# Patient Record
Sex: Male | Born: 1945 | Race: White | Hispanic: No | Marital: Married | Smoking: Former smoker
Health system: Southern US, Community
[De-identification: ages and names within clinical notes are randomized; demographics above are authoritative.]

## PROBLEM LIST (undated history)

## (undated) DIAGNOSIS — E785 Hyperlipidemia, unspecified: Secondary | ICD-10-CM

## (undated) DIAGNOSIS — I509 Heart failure, unspecified: Secondary | ICD-10-CM

## (undated) DIAGNOSIS — I2510 Atherosclerotic heart disease of native coronary artery without angina pectoris: Secondary | ICD-10-CM

## (undated) DIAGNOSIS — I10 Essential (primary) hypertension: Secondary | ICD-10-CM

## (undated) DIAGNOSIS — I255 Ischemic cardiomyopathy: Secondary | ICD-10-CM

## (undated) DIAGNOSIS — I252 Old myocardial infarction: Secondary | ICD-10-CM

## (undated) HISTORY — DX: Atherosclerotic heart disease of native coronary artery without angina pectoris: I25.10

## (undated) HISTORY — DX: Ischemic cardiomyopathy: I25.5

## (undated) HISTORY — DX: Old myocardial infarction: I25.2

## (undated) HISTORY — DX: Hyperlipidemia, unspecified: E78.5

## (undated) HISTORY — DX: Essential (primary) hypertension: I10

---

## 2006-07-31 HISTORY — PX: LEFT HEART CATH: SHX5946

## 2006-08-13 ENCOUNTER — Inpatient Hospital Stay (HOSPITAL_COMMUNITY): Admission: EM | Admit: 2006-08-13 | Discharge: 2006-08-16 | Payer: Self-pay | Admitting: Emergency Medicine

## 2006-08-13 ENCOUNTER — Ambulatory Visit: Payer: Self-pay | Admitting: Internal Medicine

## 2006-08-16 ENCOUNTER — Encounter: Payer: Self-pay | Admitting: Cardiology

## 2006-08-28 ENCOUNTER — Ambulatory Visit: Payer: Self-pay | Admitting: Cardiology

## 2006-09-26 ENCOUNTER — Ambulatory Visit: Payer: Self-pay | Admitting: Internal Medicine

## 2007-02-28 ENCOUNTER — Ambulatory Visit: Payer: Self-pay | Admitting: Internal Medicine

## 2007-06-04 ENCOUNTER — Emergency Department (HOSPITAL_COMMUNITY): Admission: EM | Admit: 2007-06-04 | Discharge: 2007-06-04 | Payer: Self-pay | Admitting: Emergency Medicine

## 2007-09-25 ENCOUNTER — Ambulatory Visit: Payer: Self-pay | Admitting: Internal Medicine

## 2008-08-24 ENCOUNTER — Ambulatory Visit: Payer: Self-pay | Admitting: Internal Medicine

## 2008-08-26 ENCOUNTER — Ambulatory Visit: Payer: Self-pay

## 2008-08-26 ENCOUNTER — Ambulatory Visit: Payer: Self-pay | Admitting: Internal Medicine

## 2008-08-26 LAB — CONVERTED CEMR LAB
AST: 28 units/L (ref 0–37)
Albumin: 3.9 g/dL (ref 3.5–5.2)
Alkaline Phosphatase: 44 units/L (ref 39–117)
BUN: 17 mg/dL (ref 6–23)
Bilirubin, Direct: 0.2 mg/dL (ref 0.0–0.3)
CO2: 33 meq/L — ABNORMAL HIGH (ref 19–32)
Calcium: 9.1 mg/dL (ref 8.4–10.5)
Chloride: 105 meq/L (ref 96–112)
GFR calc Af Amer: 88 mL/min
GFR calc non Af Amer: 72 mL/min
Potassium: 4.7 meq/L (ref 3.5–5.1)
Sodium: 141 meq/L (ref 135–145)
Total Bilirubin: 0.9 mg/dL (ref 0.3–1.2)
Total CHOL/HDL Ratio: 5.7
Total Protein: 6.6 g/dL (ref 6.0–8.3)

## 2009-02-22 ENCOUNTER — Telehealth (INDEPENDENT_AMBULATORY_CARE_PROVIDER_SITE_OTHER): Payer: Self-pay | Admitting: *Deleted

## 2009-04-27 ENCOUNTER — Encounter: Payer: Self-pay | Admitting: Internal Medicine

## 2009-05-31 ENCOUNTER — Ambulatory Visit: Payer: Self-pay | Admitting: Internal Medicine

## 2009-05-31 DIAGNOSIS — E785 Hyperlipidemia, unspecified: Secondary | ICD-10-CM

## 2009-05-31 DIAGNOSIS — I2510 Atherosclerotic heart disease of native coronary artery without angina pectoris: Secondary | ICD-10-CM

## 2009-05-31 DIAGNOSIS — I10 Essential (primary) hypertension: Secondary | ICD-10-CM

## 2009-06-15 ENCOUNTER — Ambulatory Visit (HOSPITAL_BASED_OUTPATIENT_CLINIC_OR_DEPARTMENT_OTHER): Admission: RE | Admit: 2009-06-15 | Discharge: 2009-06-15 | Payer: Self-pay | Admitting: Otolaryngology

## 2009-06-20 ENCOUNTER — Ambulatory Visit: Payer: Self-pay | Admitting: Internal Medicine

## 2009-07-08 ENCOUNTER — Ambulatory Visit (HOSPITAL_COMMUNITY): Admission: AD | Admit: 2009-07-08 | Discharge: 2009-07-09 | Payer: Self-pay | Admitting: Otolaryngology

## 2009-07-08 ENCOUNTER — Encounter (INDEPENDENT_AMBULATORY_CARE_PROVIDER_SITE_OTHER): Payer: Self-pay | Admitting: Otolaryngology

## 2009-08-16 ENCOUNTER — Telehealth: Payer: Self-pay | Admitting: Internal Medicine

## 2009-09-24 ENCOUNTER — Telehealth: Payer: Self-pay | Admitting: Internal Medicine

## 2009-09-30 ENCOUNTER — Ambulatory Visit: Payer: Self-pay | Admitting: Internal Medicine

## 2009-09-30 DIAGNOSIS — R072 Precordial pain: Secondary | ICD-10-CM

## 2009-10-01 ENCOUNTER — Encounter: Payer: Self-pay | Admitting: Internal Medicine

## 2009-10-04 ENCOUNTER — Ambulatory Visit: Payer: Self-pay | Admitting: Internal Medicine

## 2009-10-04 ENCOUNTER — Inpatient Hospital Stay (HOSPITAL_BASED_OUTPATIENT_CLINIC_OR_DEPARTMENT_OTHER): Admission: RE | Admit: 2009-10-04 | Discharge: 2009-10-04 | Payer: Self-pay | Admitting: Internal Medicine

## 2009-12-07 ENCOUNTER — Ambulatory Visit: Payer: Self-pay | Admitting: Internal Medicine

## 2010-01-24 ENCOUNTER — Telehealth (INDEPENDENT_AMBULATORY_CARE_PROVIDER_SITE_OTHER): Payer: Self-pay | Admitting: *Deleted

## 2010-01-27 ENCOUNTER — Telehealth (INDEPENDENT_AMBULATORY_CARE_PROVIDER_SITE_OTHER): Payer: Self-pay | Admitting: *Deleted

## 2010-08-28 LAB — CONVERTED CEMR LAB
Basophils Absolute: 0 10*3/uL (ref 0.0–0.1)
CO2: 32 meq/L (ref 19–32)
Calcium: 9.2 mg/dL (ref 8.4–10.5)
HCT: 45 % (ref 39.0–52.0)
INR: 1 (ref 0.8–1.0)
Lymphs Abs: 1.9 10*3/uL (ref 0.7–4.0)
Monocytes Absolute: 0.5 10*3/uL (ref 0.1–1.0)
Neutrophils Relative %: 62 % (ref 43.0–77.0)
Potassium: 4 meq/L (ref 3.5–5.1)
Prothrombin Time: 10.8 s (ref 9.1–11.7)
RBC: 4.85 M/uL (ref 4.22–5.81)
Sodium: 140 meq/L (ref 135–145)
WBC: 7.3 10*3/uL (ref 4.5–10.5)

## 2010-08-30 NOTE — Progress Notes (Signed)
  Request recieved from Dept [facility] sent to [facility]  January 27, 2010 8:00 AM

## 2010-08-30 NOTE — Cardiovascular Report (Signed)
Summary: Pre-Cath Orders  Pre-Cath Orders   Imported By: Marylou Mccoy 12/30/2009 12:08:31  _____________________________________________________________________  External Attachment:    Type:   Image     Comment:   External Document

## 2010-08-30 NOTE — Progress Notes (Signed)
Summary: increased fatigue  Phone Note Call from Patient   Summary of Call: Spoke w/pt he c/o having no energy and just feeling wiped out, he states this is the same type symptom he had before his last stent, no chest pain or discomfort and no sob, sch appt w/Dr Bensimhon for 3/3 at 3:15, pt aware if dev. cp to c/b or go to ER pt agreeable Meredith Staggers, RN  September 24, 2009 2:47 PM

## 2010-08-30 NOTE — Letter (Signed)
Summary: Cardiac Catheterization Instructions- [facility]  [facility], Main Office  [address]   [place], [place] [address]   Phone: [phone]  Fax: [phone]     09/30/2009 MRN: [id number]  [name] [address] [place], [place]  [address]  Dear Mr. FINI,   You are scheduled for a Cardiac Catheterization on Monday 3/7 with Dr.Yakima Kreitzer  Please arrive to the 1st floor of the Heart and Vascular Center at [facility] at _____ am / pm on the day of your procedure. Please do not arrive before 6:30 a.m. Call the Heart and Vascular Center at [phone] if you are unable to make your appointmnet. The Code to get into the parking garage under the building is 9000. Take the elevators to the 1st floor. You must have someone to drive you home. Someone must be with you for the first 24 hours after you arrive home. Please wear clothes that are easy to get on and off and wear slip-on shoes. Do not eat or drink after midnight except water with your medications that morning. Bring all your medications and current insurance cards with you.  ___ DO NOT take these medications before your procedure: ________________________________________________________________  ___ Make sure you take your aspirin.  _XX__ You may take ALL of your medications with water that morning. ________________________________________________________________________________________________________________________________  ___ DO NOT take ANY medications before your procedure.  ___ Pre-med instructions:  ________________________________________________________________________________________________________________________________  The usual length of stay after your procedure is 2 to 3 hours. This can vary.  If you have any questions, please call the office at the number listed above.   Meredith Staggers, RN

## 2010-08-30 NOTE — Assessment & Plan Note (Signed)
Summary: 3:15 rov   CC:  chest pain.  History of Present Illness: [name] is a very pleasant 65 year old male with a history of coronary artery disease status post anterolateral ST-elevation myocardial infarction in January 2008 treated with bare-metal stenting of his LAD.  His left circumflex and right coronary had mild luminal irregularities.  Initial ejection fraction was 30%; however, followup echo EF was 55%. Other medical history includes HTN, HL and glucose intolerance.  Had Myoview in 1/10 due to decreased exercise tolerance. Myoview showed EF 45% anteroseptal and apical infarct mild peri-infarct ischemia. Managed medically.  Recently underwent septoplasty and partial resection of uvula for bad snoring.  Comes in today for unscheduled visit for CP/. Has been working hard recently. Also started back doing Jujitsu about 1 month ago. When he started doing Jujitsu began to have discomfort in his chest. On/off for a couple days. Felt like he did before his previous MI. Has done Jujitsu since without any problem. No CP for 1 week.     Current Medications (verified): 1)  Plavix 75 Mg Tabs (Clopidogrel Bisulfate) .... Take One Tablet By Mouth Daily 2)  Carvedilol 12.5 Mg Tabs (Carvedilol) .... Take One Tablet By Mouth Twice A Day 3)  Norvasc 2.5 Mg Tabs (Amlodipine Besylate) .... Take 1 Tablet By Mouth Once A Day 4)  Potassium Chloride Crys Cr 20 Meq Cr-Tabs (Potassium Chloride Crys Cr) .... Take One Tablet By Mouth Daily 5)  Fish Oil   Oil (Fish Oil) .... Take 2 Tablest By Mouth Once Daily. 6)  Simvastatin 80 Mg Tabs (Simvastatin) .... Take One Tablet By Mouth Daily At Bedtime  Allergies (verified): 1)  ! Asa 2)  ! * Toprol  Vital Signs:  Patient profile:   65 year old male Height:      72 inches Weight:      205 pounds BMI:     27.90 Pulse rate:   61 / minute BP sitting:   138 / 82  (left arm) Cuff size:   regular  Vitals Entered By: Hardin Negus, RMA (September 30, 2009 3:38  PM)  Physical Exam  General:  Gen: well appearing. no resp difficulty HEENT: normal. ? left lid lag Neck: supple. no JVD. Carotids 2+ bilat; no bruits. No lymphadenopathy or thryomegaly appreciated. Cor: PMI nondisplaced. Regular rate & rhythm. No rubs, murmur. +s4 Lungs: clear Abdomen: soft, nontender, nondistended.Peri Jefferson bowel sounds. Extremities: no cyanosis, clubbing, rash, edema Neuro: alert & orientedx3, cranial nerves grossly intact. moves all 4 extremities w/o difficulty. affect pleasant    New Orders:     1)  EKG w/ Interpretation (93000)     2)  TLB-BMP (Basic Metabolic Panel-BMET) (80048-METABOL)     3)  TLB-CBC Platelet - w/Differential (85025-CBCD)     4)  TLB-PT (Protime) (85610-PTP)   Impression & Recommendations:  Problem # 1:  CHEST PAIN, PRECORDIAL (ICD-786.51) Symptoms concerning for progressive angina. Discussed repeat stress test vs cath. Have strongly recommended cath. he agrees to proceed. Will schedule for Monday. If has recurrent symptoms take NTG. If no relief call 911.  Other Orders: EKG w/ Interpretation (93000) TLB-BMP (Basic Metabolic Panel-BMET) (80048-METABOL) TLB-CBC Platelet - w/Differential (85025-CBCD) TLB-PT (Protime) (85610-PTP)  Patient Instructions: 1)  Labs today 2)  Your physician has requested that you have a cardiac catheterization.  Cardiac catheterization is used to diagnose and/or treat various heart conditions. Doctors may recommend this procedure for a number of different reasons. The most common reason is to evaluate chest pain.  Chest pain can be a symptom of coronary artery disease (CAD), and cardiac catheterization can show whether plaque is narrowing or blocking your heart's arteries. This procedure is also used to evaluate the valves, as well as measure the blood flow and oxygen levels in different parts of your heart.  For further information please visit [email or web address]  Please follow instruction sheet, as given. 3)   Follow up in 1 month

## 2010-08-30 NOTE — Assessment & Plan Note (Signed)
Summary: 1 month rov/sl   Visit Type:  Follow-up Primary Provider:  Dr Jeannetta Nap  CC:  no complaints.  History of Present Illness: [name] is a very pleasant 65 year old male with a history of coronary artery disease status post anterolateral ST-elevation myocardial infarction in January 2008 treated with bare-metal stenting of his LAD.  His left circumflex and right coronary had mild luminal irregularities.  Initial ejection fraction was 30%; however, followup echo EF was 55%. Other medical history includes HTN, HL, glucose intolerance and h/o snoring s/p UPPP  Had Myoview in 1/10 due to decreased exercise tolerance. Myoview showed EF 45% anteroseptal and apical infarct mild peri-infarct ischemia. Managed medically.  Had cath march 3/11 for CP:  1. Coronary artery disease with a patent left anterior descending     stent. 2. Otherwise nonobstructive coronary artery disease. 3. Ejection fraction 40-45% with anteroapical wall motion abnormality.   PLAN:  Continued medical therapy.  Doing well. Still gets mild CP on occasion when lifting over 100 pounds at work. No groin complications.   Current Medications (verified): 1)  Plavix 75 Mg Tabs (Clopidogrel Bisulfate) .... Take One Tablet By Mouth Daily 2)  Carvedilol 12.5 Mg Tabs (Carvedilol) .... Take One Tablet By Mouth Twice A Day 3)  Norvasc 2.5 Mg Tabs (Amlodipine Besylate) .... Take 1 Tablet By Mouth Once A Day 4)  Potassium Chloride Crys Cr 20 Meq Cr-Tabs (Potassium Chloride Crys Cr) .... Take One Tablet By Mouth Daily 5)  Fish Oil   Oil (Fish Oil) .... Take 2 Tablest By Mouth Once Daily. 6)  Simvastatin 80 Mg Tabs (Simvastatin) .... Take One Tablet By Mouth Daily At Bedtime  Allergies (verified): 1)  ! Asa 2)  ! * Toprol  Vital Signs:  Patient profile:   65 year old male Height:      72 inches Weight:      199 pounds BMI:     27.09 Pulse rate:   85 / minute BP sitting:   120 / 74  (left arm) Cuff size:   regular  Vitals  Entered By: Hardin Negus, RMA (Dec 07, 2009 3:49 PM)  Physical Exam  General:  Gen: well appearing. no resp difficulty HEENT: normal. ? left lid lag Neck: supple. no JVD. Carotids 2+ bilat; no bruits. No lymphadenopathy or thryomegaly appreciated. Cor: PMI nondisplaced. Regular rate & rhythm. No rubs, murmur. +s4 Lungs: clear Abdomen: soft, nontender, nondistended.Peri Jefferson bowel sounds. Extremities: no cyanosis, clubbing, rash, edema. goin ok no bruit. Neuro: alert & orientedx3, cranial nerves grossly intact. moves all 4 extremities w/o difficulty. affect pleasant    Impression & Recommendations:  Problem # 1:  CAD (ICD-414.00) Stable. Recent cath very reasurring. Continue medical therapy. Asked him to avoid very heavy lifting if possible.

## 2010-08-30 NOTE — Progress Notes (Signed)
Summary: Veterans Request  Records request received from the fax machine. Forwarded to [employer] for processing.

## 2010-08-30 NOTE — Progress Notes (Signed)
Summary: cost of plavix  Phone Note Call from Patient Call back at Home Phone [phone] Call back at Work Phone [phone]   Caller: Patient Reason for Call: Talk to Nurse Details for Reason: need a gentric brand - plavix cost is 187.00 @ [facility].  Initial call taken by: Lorne Skeens,  August 16, 2009 2:59 PM  Follow-up for Phone Call        Pt cannot afford Plavix. He is not on any ASA. I suggested if he couldn't take the Plavix that he at least take 325mg  ASA daily. Pt will do his best to get Plavix again when he can afford it. Follow-up by: Duncan Dull, RN, BSN,  August 16, 2009 3:53 PM     Appended Document: cost of plavix ok to use asa 81 daily

## 2010-11-01 LAB — CBC
HCT: 44.1 % (ref 39.0–52.0)
Hemoglobin: 15.3 g/dL (ref 13.0–17.0)
MCHC: 34.8 g/dL (ref 30.0–36.0)
MCV: 90.2 fL (ref 78.0–100.0)
Platelets: 187 10*3/uL (ref 150–400)
RBC: 4.89 MIL/uL (ref 4.22–5.81)
RDW: 12.1 % (ref 11.5–15.5)
WBC: 5.9 10*3/uL (ref 4.0–10.5)

## 2010-11-01 LAB — URINALYSIS, ROUTINE W REFLEX MICROSCOPIC
Bilirubin Urine: NEGATIVE
Glucose, UA: NEGATIVE mg/dL
Hgb urine dipstick: NEGATIVE
Ketones, ur: NEGATIVE mg/dL
Nitrite: NEGATIVE
Protein, ur: NEGATIVE mg/dL
Specific Gravity, Urine: 1.016 (ref 1.005–1.030)
Urobilinogen, UA: 1 mg/dL (ref 0.0–1.0)
pH: 7 (ref 5.0–8.0)

## 2010-11-01 LAB — BASIC METABOLIC PANEL
BUN: 14 mg/dL (ref 6–23)
Calcium: 9.6 mg/dL (ref 8.4–10.5)
Chloride: 100 mEq/L (ref 96–112)

## 2010-11-01 LAB — BASIC METABOLIC PANEL WITH GFR
CO2: 31 meq/L (ref 19–32)
Creatinine, Ser: 0.99 mg/dL (ref 0.4–1.5)
GFR calc Af Amer: >60 mL/min (ref >60)
GFR calc non Af Amer: >60 mL/min (ref >60)
Glucose, Bld: 125 mg/dL — ABNORMAL HIGH (ref 70–99)
Potassium: 4 meq/L (ref 3.5–5.1)
Sodium: 141 meq/L (ref 135–145)

## 2010-12-02 ENCOUNTER — Telehealth: Payer: Self-pay | Admitting: Internal Medicine

## 2010-12-02 NOTE — Telephone Encounter (Signed)
error 

## 2010-12-13 NOTE — Assessment & Plan Note (Signed)
[facility]                            CARDIOLOGY OFFICE NOTE   NAME:[name], [name]                        MRN:          [id number]  DATE:08/24/2008                            DOB:          Jul 16, 1946    PRIMARY CARE PHYSICIAN:  Windle Guard, MD, also Dr. Milon Dikes at  [facility].   INTERVAL HISTORY:  [name] is a very pleasant 65 year old male with a  history of coronary artery disease status post anterolateral ST-  elevation myocardial infarction in January 2008 treated with bare-metal  stenting of his LAD.  His left circumflex and right coronary had mild  luminal irregularities.  Initial ejection fraction was 30%; however,  followup echo EF was 55%.   He returns today for routine followup.  He continues to work, though he  notices that his exercise tolerance is not what it used to be especially  when he is out hunting.  Over the past few months, he has had several  episodes of substernal chest pressure mostly notices these when his  blood pressure is elevated or he is under stressful situation.  His  primary care doctor has increased his lisinopril and he says he feels  somewhat better.  His blood pressure has been under better control.  He  denies any orthopnea, no PND, no lower extremity edema.   CURRENT MEDICATIONS:  1. Plavix 75 a day.  2. Simvastatin 80 a day.  3. Lisinopril hydrochlorothiazide 20/25 b.i.d.  4. Carvedilol 12.5 b.i.d.   PHYSICAL EXAMINATION:  GENERAL:  No acute distress, ambulates around the  clinic without any respiratory difficulty.  VITAL SIGNS:  Initial blood pressure was 142/82, on manual recheck  132/76, heart rate is 68, weight is 207, which is up 17 pounds.  HEENT:  Normal.  NECK:  Supple.  No JVD.  Carotids are 2+ bilaterally without any bruits.  There is no lymphadenopathy or thyromegaly.  CARDIAC:  PMI is nondisplaced.  Irregular rate and rhythm with an S4, no  murmurs.  LUNGS:   Clear.  ABDOMEN:  Soft, nontender, nondistended.  No hepatosplenomegaly, no  bruits, no masses.  Good bowel sounds.  EXTREMITIES:  Warm with no  cyanosis, clubbing, or edema.  No rash.  NEURO:  Alert and oriented x3.  Cranial nerves II through XII are  intact.  Moves all 4 extremities without difficulty.  Affect is normal.   EKG shows normal sinus rhythm at a rate of 68 with anteroseptal Q waves  which are old.   ASSESSMENT AND PLAN:  1. Coronary artery disease status post previous myocardial infarction.      He is having some recurrent chest pressure, which has both typical      and atypical features.  This may be simply related to his      hypertension, but I am concerned about a current ischemia.  We will      schedule a treadmill Myoview to follow up.  2. Hypertension.  Blood pressure remains mildly elevated.  We will add      Norvasc 2.5 a day.  3.  Hyperlipidemia.  We recently increased his simvastatin to 80.  His      goal LDL is less than 70.  We will follow up with his lipid panel      this week.  He does have low HDL.  We will start him on fish oil 2      g a day.  We will consider Niaspan in the future, though he has had      some difficulty tolerating cholesterol medications in the past.   DISPOSITION:  Pending the results of his stress test.     Bevelyn Buckles. Bensimhon, MD  Electronically Signed    DRB/MedQ  DD: 08/24/2008  DT: 08/25/2008  Job #: [id number]   cc:   Windle Guard, M.D.  Dr. Milon Dikes

## 2010-12-13 NOTE — Assessment & Plan Note (Signed)
[facility]                            CARDIOLOGY OFFICE NOTE   NAME:[name], [name]                        MRN:          [id number]  DATE:02/28/2007                            DOB:          1945/10/20    ADDENDUM:  From dictation [phone].   PROBLEMS CONTINUED:  Dry cough.  I am not sure if this could be related  to his ACE inhibitors.  I am also concerned about possible reflux  disease given his occasional intermittent loss of his voice.  We will  start him on Prilosec 20 mg daily.  If that does not work, we will  switch his Lisinopril HCTZ over to Avalide 150/12.5 and see how he does.     Bevelyn Buckles. Bensimhon, MD  Electronically Signed    DRB/MedQ  DD: 02/28/2007  DT: 03/01/2007  Job #: [id number]

## 2010-12-13 NOTE — Assessment & Plan Note (Signed)
[facility]                            CARDIOLOGY OFFICE NOTE   NAME:CAUDLEJorah, Hua                        MRN:          [id number]  DATE:02/28/2007                            DOB:          10-13-1945    PRIMARY CARE PHYSICIAN:  Windle Guard, M.D.   INTERVAL HISTORY:  Mr. Pomplun is a delightful 65 year old male with the  history of coronary artery disease, status post anterolateral ST  elevation myocardial infarction in January of 2008, treated with bare-  metal stenting of the LAD.  His EF was initially 30, now on most recent  echo was 55%.  He continues to do quite well.  He is walking/jogging two  miles almost every day.  He denies any chest pain or shortness of  breath.  His only real problem is significant fatigue and he actually  cut his Coreg back to 12.5 once a day to see if this would help and he  did not have real benefit from this.  He also complains of a chronic  dry, hacking cough, which has happened really primarily since his heart  attack.  He says sometimes that he loses his voice with it too.   CURRENT MEDICATIONS:  1. Plavix 75 a day.  2. Simvastatin 80 a day.  3. Coreg 12.5 once a day.  4. Lisinopril/HCTZ 20/12.5 b.i.d.   ALLERGIES:  He is allergic to ASPIRIN, due to hives.   PHYSICAL EXAM:  He is a thin, well-appearing male, in no acute distress.  He ambulates around the clinic without any respiratory difficulty.  Blood pressure is 108/70, heart rate 60, weight is 182, which is down 20  pounds from earlier in the year.  HEENT:  Normal.  NECK:  Supple, no JVD.  Carotids are 2+ bilaterally without any bruits.  There is no lymphadenopathy or thyromegaly.  CARDIAC:  He has a regular rate and rhythm with an S4, no murmur.  PMI  is nondisplaced.  LUNGS:  Clear.  ABDOMEN:  Soft, nontender, nondistended.  No hepatosplenomegaly, no  bruits, no masses, good bowel sounds.  EXTREMITIES:  Warm with no cyanosis, clubbing or edema.   Good pulses, no  rash.  NEUROLOGIC:  He is alert and oriented times three.  Cranial nerves II  through XII are intact.  Moves all four extremities without difficulty.  Affect is bright.   EKG shows normal sinus rhythm at a rate of 65.  There is evidence of  previous anterolateral infarct.   ASSESSMENT AND PLAN:  1. Coronary artery disease, status post previous myocardial      infarction.  He is doing well, without any signs of ischemia.      Continue current therapy.  2. Hypertension, well-controlled.  3. Hyperlipidemia.  He is on simvastatin.  We will recheck his lipids      and a liver panel.  Goal LDL is 70.  4. Fatigue.  I suspect this is multifactorial.  He does not have much      snoring.  We will try to cut his Coreg back to 6.25 a day and  see      how he does.  He will follow up with Dr. Jeannetta Nap.  Should it      continue, it may be reasonable to check his thyroid and possibly      even a sleep study.  5. Dry cough.  I am not sure if this could be related to his ACE      inhibitors.  I am also concerned about possible reflux disease      given his occasional intermittent loss of his voice.  We will start      him on Prilosec 20 mg daily.  If that does not work, we will switch      his Lisinopril HCTZ over to Avalide 150/12.5 and see how he does.     Bevelyn Buckles. Bensimhon, MD  Electronically Signed    DRB/MedQ  DD: 02/28/2007  DT: 03/01/2007  Job #: [id number]   cc:   Windle Guard, M.D.

## 2010-12-13 NOTE — Assessment & Plan Note (Signed)
[facility]                            CARDIOLOGY OFFICE NOTE   NAME:CAUDLETristram, Milian                        MRN:          [id number]  DATE:09/25/2007                            DOB:          June 30, 1946    PRIMARY CARE PHYSICIAN:  Windle Guard, M.D.   INTERVAL HISTORY:  Mr. Bogacz is a delightful 65 year old male with a  history of coronary artery disease status post anterolateral ST  elevation myocardial infarction January 2008 treated with bare metal  stenting of the LAD.  His left circumflex and the right coronary has had  minor luminal irregularities.  Initial ejection fraction was 30%.  However, on followup echocardiogram EF was 55%.  He returns today for  routine followup.  He is back to work without any complaints.  He  stopped his exercise program due to the cold.  He was having significant  problems with insomnia and stopped his Coreg and also stopped his  nighttime lisinopril.  He has not had problems of any chest pain or  heart failure symptoms.   CURRENT MEDICATIONS:  1. Plavix 75 a day.  2. Simvastatin 80 a day.  3. Lisinopril hydrochlorothiazide 20/12.5 once a day.   PHYSICAL EXAM:  He is well-appearing in no acute distress.  Ambulates  around the clinic without any respiratory difficulty.  Blood pressure is  140/72, heart rate 70,  weight is 190.  HEENT is normal.  Neck is supple.  No JVD.  Carotid 2+ bilaterally bruits.  There is no  lymphadenopathy or thyromegaly.  CARDIAC:  PMI is nondisplaced.  He has  a regular rate and rhythm with S4, no murmur.  LUNGS:  Clear.  ABDOMEN:  Soft, nontender, nondistended. No hepatosplenomegaly, no  bruits, no masses.  Good bowel sounds.  EXTREMITIES:  Warm.  No cyanosis, clubbing or edema.  Good pulses.  No  rash.  NEUROLOGIC:  He is alert and oriented x3.  Cranial nerves II-XII are  intact.  Moves all four extremities difficulty.  Affect is normal.   EKG shows normal sinus rhythm at a rate  of 70.  He has anterior septal  infarct with persistent ST elevation in V1-V3 with T-wave inversions in  those leads as well.   ASSESSMENT/PLAN:  1. Coronary artery disease status post previous myocardial infarction.      He is doing well.  No signs of ischemia.  Continue current therapy.  2. Hypertension.  His blood pressure is elevated in the setting of      medication noncompliance.  I have asked him to restart his Coreg at      6.25 b.i.d. and will need to titrate this to get his systolic blood      pressure under with 130.  3. Hyperlipidemia.  Continue statin.  Goal LDL is less than 70.  We      have given a prescription for him to get his lipids and liver      function tested and he will fax the results of these when he gets      them.   DISPOSITION:  He will follow up with Dr. Jeannetta Nap and we will see him back  in 6 months time.     Bevelyn Buckles. Bensimhon, MD  Electronically Signed    DRB/MedQ  DD: 09/25/2007  DT: 09/26/2007  Job #: [id number]   cc:   Windle Guard, M.D.

## 2010-12-16 NOTE — Assessment & Plan Note (Signed)
[facility]                            CARDIOLOGY OFFICE NOTE   [name], [name]                        MRN:          [id number]  DATE:09/26/2006                            DOB:          [name], [name]    REFERRING PHYSICIAN:  Windle Guard, M.D.   INTERVAL HISTORY:  [name] is a very pleasant 65 year old male with a  history of coronary artery disease status post anterolateral ST  elevation myocardial infarction in January of 2008, treated with bare-  metal stenting of the LAD, who presents today for routine followup.   Initially, post catheterization his EF was Miller%, however, on followup  echocardiogram, EF is now 55%.  He is doing quite well.  He is walking 2  miles every morning, and during most of these walks he breaks into a jog  for 1/2 mile.  He does not have any chest pain or shortness of breath.  He is also back to work and lifting 80 to 90 pounds at a time.  He  denies any heart failure symptoms.  He has been very compliant with  medications.   CURRENT MEDICATIONS:  1. Plavix 75 a day.  2. Lisinopril 20.  3. Hydrochlorothiazide 12.5.  4. Simvastatin 40.  5. Coreg 12.5 b.i.d.   ALLERGIES:  1. TOPROL CAUSES ERECTILE DYSFUNCTION.  2. ASPIRIN CAUSES HIVES.   PHYSICAL EXAMINATION:  He is well appearing.  Ambulates around the  clinic briskly without any respiratory difficulty.  Blood pressure is 144/80.  Heart rate is 64.  Weight is 200.  HEENT:  Sclerae anicteric.  EOMI.  There is no xanthelasma.  Mucous  membranes are moist. Oropharynx is clear.  NECK:  Supple.  No JVD.  Carotids are 2+ bilaterally without any bruits.  There is no lymphadenopathy or thyromegaly.  CARDIAC:  Regular rate and rhythm.  Plus S4.  No murmur.  LUNGS:  Clear.  ABDOMEN:  Soft, non-tender and non-distended.  No hepatosplenomegaly.  No bruits.  No masses.  Good bowel sounds.  EXTREMITIES:  Warm with no cyanosis, clubbing or edema.  SKIN:  He has psoriasis  and has plaque on his back.  NEUROLOGIC:  Alert and oriented x3.  Cranial nerves 2 through 12 are  intact.  Moves all 4 extremities without difficulty.  Affect is bright.   ASSESSMENT AND PLAN:  1. Coronary artery disease status post recent anterolateral myocardial      infarction.  He is doing quite without any events of ongoing      ischemia.  Continue current regimen.  2. Hypertension.  Blood pressure is still elevated.  We will increase      his Lisinopril to 40 a day with hydrochlorothiazide at 25 a day.      We will check labs next week.  3. Hyperlipidemia.  We will continue him on his Simvastatin and check      a lipid panel next week.  He will fax this to [place].  We will titrate      to keep his LDL below 70.   DISPOSITION:  We will see  him back in 6 months.  I have given him a slip  to return to his martial arts classes.  I did ask him, if at all  possible, he try to limit his weight lifting to under 50 to 70 pounds.     Bevelyn Buckles. Bensimhon, MD  Electronically Signed    DRB/MedQ  DD: 09/26/2006  DT: 09/26/2006  Job #: [id number]   cc:   Windle Guard, M.D.

## 2010-12-16 NOTE — Discharge Summary (Signed)
[name], [name]                 ACCOUNT NO.:  [id number]   MEDICAL RECORD NO.:  [id number]          PATIENT TYPE:  INP   LOCATION:  2018                         FACILITY:  [facility]   PHYSICIAN:  Bevelyn Buckles. Bensimhon, MDDATE OF BIRTH:  11-Mar-1946   DATE OF ADMISSION:  08/13/2006  DATE OF DISCHARGE:  08/16/2006                               DISCHARGE SUMMARY   PRIMARY CARDIOLOGIST:  Bevelyn Buckles. Bensimhon, MD   PRINCIPAL DIAGNOSES:  1. Acute anterolateral ST segment elevation.  2. Mycoardial infarction/coronary artery disease.   SECONDARY DIAGNOSES:  1. Hyperlipidemia.  2. Hypertension.  3. Ischemic cardiomyopathy.   ALLERGIES:  ASPIRIN CAUSES RASH.   PROCEDURE:  Left heart cardiac catheterization with successful PCI and  stenting of the proximal LAD with placement of a 3.5 x 20 mm Liberte  bare-metal stent.   HISTORY OF PRESENT ILLNESS:  A 65 year old male with prior history of  hypertension who was previously very active, who was on his way home  from [employer] class on the evening of August 13, 2006, when he had  sudden onset of 10/10 substernal chest pain with shortness of breath  while driving.  He drove to his home, took an aspirin and called 911 and  upon EMS arrival he was noted to have anterolateral ST segment elevation  in anterior leads with Q-waves.  He was then taken emergently to [facility] where he was taken to the cardiac cath lab for further evaluation.   HOSPITAL COURSE:  Emergent cardiac catheterization was performed  revealing a total occlusion of the proximal LAD as well as 90% stenosis  in a septal branch.  He then underwent successful PCI and stenting of  the proximal LAD with a 3.5 x 20 mm Liberte bare-metal stent. He was  noted to have anterior hypokinesis with apical akinesis and EF of 30% by  left ventriculography.  He was taken to the coronary intensive care unit  and initiated on beta blocker and ACE inhibitor therapy as well as  Statin and Plavix.   Aspirin was not used secondary to previously  documented allergy.  He was transferred out to the floor on January 16  and has been ambulating with cardiac rehab without any new symptoms or  limitations.  A 2D echocardiogram was performed this morning and there  was no evidence of apical clot.  He is therefore being discharged home  today in satisfactory condition.   DISCHARGE LABS:  Hemoglobin 12.8, hematocrit 37.0, WBC 12.6, platelets  277, MCV 88.8, sodium 139, potassium 4.1, chloride 107, CO2 25, BUN 12,  creatinine 0.94, glucose 102.  PT 14.4, INR 1.1, PTT 28.  Total  bilirubin 0.9, alkaline phosphatase 58, AST 86, ALT 28, albumin 3.3,  peak CK 2784, peak MB 367.8, troponin I 0.24.  Total cholesterol 177,  triglycerides 147, HDL 28, LDL 120, calcium 8.5, magnesium 2.0, BNP  645.0.   DISPOSITION:  The patient is being discharge home today in good  condition.  Follow up plans and appointment.  He has a followup  appointment with Dr. Prescott Gum nurse practitioner or PA on  January 29  at 9:45 a.m.   DISCHARGE MEDICATIONS:  1. Plavix 75 mg q.d.  2. Lisinopril 20 mg q.d.  3. Coreg 12.5 mg b.i.d.  4. Simvastatin 80 mg q.h.s.  5. Nitroglycerin 0.4 mg sublingual p.r.n. chest pain.   OUTSTANDING LAB STUDIES:  None.   DURATION OF DISCHARGE ON THE COUNTER:  Thirty-five minutes including  physician time.      Nicolasa Ducking, ANP      Bevelyn Buckles. Bensimhon, MD  Electronically Signed    CB/MEDQ  D:  08/16/2006  T:  08/17/2006  Job:  [id number]

## 2010-12-16 NOTE — Cardiovascular Report (Signed)
[name], [name]                 ACCOUNT NO.:  [id number]   MEDICAL RECORD NO.:  [id number]          PATIENT TYPE:  INP   LOCATION:  1831                         FACILITY:  [facility]   PHYSICIAN:  [name] R. Juanda Chance, MD, FACCDATE OF BIRTH:  14-Jul-1946   DATE OF PROCEDURE:  08/13/2006  DATE OF DISCHARGE:                            CARDIAC CATHETERIZATION   CLINICAL HISTORY:  Mr. [name] is 65 years old and has no prior history  of known heart disease.  He has hypertension and a positive family  history for coronary heart disease.  He developed the onset of chest  pain at 2115 today and called EMS and came to [facility] via EMS.  EMS calls code STEMI and after a stop in the  emergency room, he is brought promptly to the catheterization laboratory  for angiography and probable intervention.   PROCEDURE:  The procedure was performed by the right femoral arteries  and arterial sheath and 6-French preformed coronary catheters.  A front  wall arterial puncture was performed and Omnipaque contrast was used.  We first did a diagnostic study of the right coronary, then went in with  a Q-4 6-French guiding catheter with side holes.  After identified there  was a total occlusion of the LAD, preparation was made for angioplasty.   The patient's initial ACT was 230 and he was given additional Angiomax  bolus and infusion.  He also was enrolled in the CHAMPION study and was  given either Plavix or Cangrelor study drug.  We crossed the totally  occluded LAD with a Prowater wire without much difficulty.  We then  performed balloon dilatation with a 2.25 x 20 mm Maverick performing two  inflations at 10 atmospheres for 30 seconds.  We then deployed a 3.5 x  20 mm Liberte stent deploying this with one inflation up to 14  atmospheres for 30 seconds.  We then post dilated with a 4 x 15 mm  Quantum Maverick performing two inflations of 16 atmospheres for 30  seconds.  We obtained TIMI-3  flow after initial balloon inflation, but  after the post dilatations, the flow decreased to TIMI-2.  It took three  cycles for the contrast to reach the apex and there was slight pulsatile  flow.  We gave a total of 800 mg of verapamil which helped [place] slightly  but still did not quite achieve TIMI-3 flow.  The left ventriculogram  was performed after the angioplasty.  The right femoral artery was  closed with Angio-Seal at the end of the procedure.  Patient tolerated  the procedure well and left the laboratory in satisfactory condition.   RESULTS:  The aortic pressure was 138/86 with mean of 111. Left  ventricular pressure was 138/19.   Left main coronary artery:  The left main coronary artery was free of  disease.   Left anterior descending artery:  The left anterior descending artery  gave rise to a septal perforator and diagonal branch and then was  completely occluded.   Circumflex artery:  The circumflex artery gave rise to a  ramus branch, a  marginal branch and three posterolateral branches.  These vessels were  irregular but there is no significant obstruction.   Right coronary artery:  The right coronary artery was a dominant vessel,  gave rise to a conus branch, two right ventricular branch, a posterior  branch and two posterolateral branches.  This vessel was irregular but  there is no significant obstruction.   Left ventriculogram:  The left ventriculogram performed in the RAO  projection showed hypokinesis of the anterolateral wall and akinesis of  the apex.  The estimated ejection fraction was 30%.   Following stenting of the occluded left anterior descending artery, the  stenosis improved from 100% to 0% and the flow improved from TIMI-0 to  TIMI-2 flow.  A large diagonal branch had a 90% ostial stenosis.   The patient had the onset of chest pain at 2115.  Code STEMI was called  by EMS at 2042.  The patient arrived in the ED at 2056.  The patient  arrived in the  cath lab at 2322 and the first balloon inflation occurred  at 2346.  This gave a door balloon time of 50 minutes and a reperfusion  time of 2 hours and 31 minutes.   CONCLUSION:  1. Acute anterior wall myocardial infarction with total occlusion of      proximal left anterior descending, no significant obstruction of      the circumflex and right coronary arteries, and anterolateral wall      hypokinesis and apical wall akinesis with an estimated ejection      fraction of 30%.  2. Successful reperfusion and stenting of the totally occluded left      anterior descending artery using a Liberte bare metal stent with      improvement in percent of narrowing from 100% to 0% and improvement      of flow from TIMI-0 to TIMI-2 flow.   DISPOSITION:  The patient returned to the post anesthesia care unit for  further observation.  Because of the TIMI-2 flow, will continue the  Angiomax for about 4 hours.  The patient will be continued on his ACE  inhibitors and started on a beta blocker as well as a Statin.  Although  he had a large infarct, the reperfusion was early and the Miller of  recovery are reasonably good, although the flow was not quite TIMI-3 at  the end of the procedure.      [name] Elvera Lennox Juanda Chance, MD, [facility]  Electronically Signed     BRB/MEDQ  D:  08/14/2006  T:  08/14/2006  Job:  [id number]   cc:   Windle Guard, M.D.  Bevelyn Buckles. Bensimhon, MD  Cardiopulmonary Lab

## 2010-12-16 NOTE — Cardiovascular Report (Signed)
NAMEHILTON, SAEPHAN NO.:  [id number]   MEDICAL RECORD NO.:  [id number]          PATIENT TYPE:  EMS   LOCATION:  MAJO                         FACILITY:  [facility]   PHYSICIAN:  Bevelyn Buckles. Bensimhon, MDDATE OF BIRTH:  [birth year]   DATE OF PROCEDURE:  DATE OF DISCHARGE:                            CARDIAC CATHETERIZATION   REASON FOR ADMISSION:  Acute anterolateral ST elevation myocardial  infarction.   HISTORY OF PRESENT ILLNESS:  [name] is a 65 year old male with a  history of hypertension.  There is no known history of coronary artery  disease.  He has never had a cardiac catheterization.  There is no  history of hyperlipidemia or diabetes.  He does have a strong family  history of coronary artery disease.  He is very active.  Tonight about  9:00 p.m. while driving home from [employer]ujitsu class, he had the acute onset  of 10/10 chest pain.  He went home, took some aspirin and called 9-1-1.  A 12-lead in the trunk showed acute anterior lateral ST elevation with  developing Q waves.  He continues to complain of 6/10 chest pain.  He  has received intravenous heparin as well as 10 mg of IV Lopressor.   Of note, he did have a 30-minute episode of chest pain last week of  which he did not seek treatment.   REVIEW OF SYSTEMS:  Negative for fevers, chills.  There has been no  melena, no bright red blood per rectum.  There is no heart failure or  claudication.  The remainder of review of systems negative except for  HPI.   PROBLEM LIST:  Hypertension.   MEDICATIONS:  Lisinopril 20 mg a day.   ALLERGIES:  ASPIRIN WHICH CAUSES RASH AND HIVE.   SOCIAL HISTORY:  He is married, lives in [place] with his wife.  He works at a [employer].  Denies any tobacco or significant alcohol  use.   FAMILY HISTORY:  Notable for brother who is 2 years older than him with  a history of several myocardial infarctions.   PHYSICAL EXAMINATION:  GENERAL:  He is lying in bed,  relatively  comfortable, though continues to complain of 6/10 chest pain.  VITAL SIGNS:  Blood pressure is 150/83, heart rate is 100.  He is on a  non-rebreather, though his respirations are not labored.  HEENT:  Sclerae anicteric.  EOMI.  There are no xanthelasma.  Mucous  membranes are moist.  NECK:  Supple.  No JVD.  Carotid two plus bilateral bruits.  There is no  lymphadenopathy or thyromegaly.  CARDIAC:  He is tachycardiac and  regular.  No obvious murmur.  LUNGS:  Clear.  ABDOMEN:  Soft, nontender, nondistended.  No hepatosplenomegaly.  No  bruits.  No masses.  Good bowel sounds.  EXTREMITIES:  Warm with no cyanosis, clubbing or edema.  Distal pulses  are 1+ bilaterally.  NEUROLOGICAL:  Alert and oriented x3.  Cranial nerves II-XII intact.  Moves all four extremities without difficulty.   STUDIES:  EKG shows sinus tachycardia with anterolateral ST elevations  and developing  Q waves.  Labs are pending.   ASSESSMENT/PLAN:  1. Acute anterior lateral ST elevation myocardial infarction.  He will      need a urgent cardiac catheterization.  He has agreed to enroll in      the Horizons trial.  We will not use aspirin given his documented      allergy.  Instead, he will be treated with Plavix.  May also need      aggressive risk factor management.      Bevelyn Buckles. Bensimhon, MD  Electronically Signed     DRB/MEDQ  D:  08/13/2006  T:  08/14/2006  Job:  [id number]

## 2010-12-16 NOTE — Assessment & Plan Note (Signed)
[facility]                            CARDIOLOGY OFFICE NOTE   NAME:[name], [name]                        MRN:          [id number]  DATE:08/28/2006                            DOB:          May 11, 1946    This is a 65 year old married white male patient who presented to [facility] with an acute anterolateral ST segment elevation MI on August 13, 2006.  He was taken to the cath lab by Dr. Gala Romney who found he  had total occlusion of proximal LAD, as well as a 90% septal perforator.  He underwent successful PCI and stenting of the proximal LAD with a bare  metal stent by Dr. Charlies Constable.  On cath his EF was 30% with the  interior hypokineses.  He had followup 2D echo which showed improvement  of his EF at 55%, there was no evidence of clot.  He had akinesis of the  entire periapical wall and mid distal septal wall.  The diagonal  stenosis was not opened.   Since the patient has been home he is doing quite well.  He came in with  a list of multiple questions.  He is walking about 2 miles in an hour  each day without problems.  He is eating well.  In the evening he does  notice he feels his blood flowing through his heart.  He denies any  palpitations, chest tightness, heaviness, or any symptoms similar to  what brought him in, it is more of an awareness.  He also notes since  his Zestoretic was stopped and Lisinopril started, he has had a little  bit more swelling and his blood pressure goes up to about 135/76 in the  afternoon.  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  He does do heavy lifting, a little over  100 pounds, but is allowed to go to light duty if needed.  He is also  wanting to start more strenuous exercise other than his walking, but is  unable to manage cardiac rehab with going back to work.   CURRENT MEDICATIONS:  1. Plavix 75 mg daily.  2. Lisinopril 20 mg daily.  3. Simvastatin  40 mg daily.  4. Coreg 12.5 mg b.i.d.   PHYSICAL EXAMINATION:  This is a pleasant 65 year old white male, in no  acute distress.  Blood pressure is 130/86, pulse 64.  NECK:  Without JVD, HJR, or bruit, or thyroid enlargement.  LUNGS:  Clear.  Anterior, posterior, and lateral heart regular and rhythm at 60 beats  per minute.  A 1/6 systolic murmur at the apex.  ABDOMEN:  Soft without organomegaly, masses, lesions, or abnormal  tenderness.  Right groin is stable without hematoma or hemorrhage.  EXTREMITIES:  Without cyanosis, clubbing, or edema, he has good distal  pulses.   IMPRESSION:  1. Status post acute anterolateral ST segment elevation myocardial      infarction on August 13, 2006.  Treated with bare metal stenting      to the left anterior descending, has  residual 90% diagonal.  2. Ischemic cardiomyopathy ejection fraction initially 30% but      followup 2D echocardiogram showed improvement to 55%.  3. Hypertension.  4. Hyperlipidemia.   PLAN:  At this time I have changed the patient back from lisinopril to  Zestoretic, which he was taking prior to admission, and feels he had  better blood pressure control on.  I told him he can go back to light  duty work starting next week.  I have asked him to do no strenuous  exercise, heavy lifting, or activity until he is cleared by the  cardiologist.  He is also concerned about how long he needs to take  Plavix and I asked him to continue on this and discuss with Dr.  Gala Romney at his next appointment.  The patient will see Dr. Gala Romney  in one month.      Jacolyn Reedy, PA-C  Electronically Signed      Arturo Morton. Riley Kill, MD, [facility]  Electronically Signed   ML/MedQ  DD: 08/28/2006  DT: 08/28/2006  Job #: [id number]

## 2011-05-09 LAB — I-STAT 8, (EC8 V) (CONVERTED LAB)
Acid-Base Excess: 7 — ABNORMAL HIGH
BUN: 14
Bicarbonate: 34 — ABNORMAL HIGH
Chloride: 102
Glucose, Bld: 92
HCT: 44
Hemoglobin: 15
Operator id: 257131
Potassium: 3.7
Sodium: 139
TCO2: 36
pCO2, Ven: 58.7 — ABNORMAL HIGH
pH, Ven: 7.371 — ABNORMAL HIGH

## 2011-05-09 LAB — POCT I-STAT CREATININE
Creatinine, Ser: 1.2
Operator id: 257131

## 2011-05-09 LAB — CBC
Hemoglobin: 14.4
MCHC: 34.7

## 2011-05-09 LAB — URINALYSIS, ROUTINE W REFLEX MICROSCOPIC
Ketones, ur: NEGATIVE
Urobilinogen, UA: 1

## 2011-05-09 LAB — OCCULT BLOOD X 1 CARD TO LAB, STOOL: Fecal Occult Bld: POSITIVE

## 2013-06-12 ENCOUNTER — Encounter: Payer: Self-pay | Admitting: *Deleted

## 2013-06-16 ENCOUNTER — Encounter: Payer: Self-pay | Admitting: *Deleted

## 2013-06-16 ENCOUNTER — Encounter: Payer: Self-pay | Admitting: Cardiology

## 2013-06-16 ENCOUNTER — Ambulatory Visit (INDEPENDENT_AMBULATORY_CARE_PROVIDER_SITE_OTHER): Payer: Medicare Other | Admitting: Cardiology

## 2013-06-16 ENCOUNTER — Other Ambulatory Visit: Payer: Self-pay | Admitting: *Deleted

## 2013-06-16 VITALS — BP 132/80 | HR 62 | Ht 72.0 in | Wt 203.0 lb

## 2013-06-16 DIAGNOSIS — I10 Essential (primary) hypertension: Secondary | ICD-10-CM

## 2013-06-16 DIAGNOSIS — E785 Hyperlipidemia, unspecified: Secondary | ICD-10-CM

## 2013-06-16 DIAGNOSIS — I2510 Atherosclerotic heart disease of native coronary artery without angina pectoris: Secondary | ICD-10-CM

## 2013-06-16 LAB — LIPID PANEL
Cholesterol: 138 mg/dL (ref 0–200)
HDL: 29.5 mg/dL — ABNORMAL LOW (ref >39.00)
LDL Cholesterol: 75 mg/dL (ref 0–99)
VLDL: 33.2 mg/dL (ref 0.0–40.0)

## 2013-06-16 MED ORDER — POTASSIUM CHLORIDE CRYS ER 20 MEQ PO TBCR: 20.0000 meq | EXTENDED_RELEASE_TABLET | Freq: Every day | ORAL | Status: DC

## 2013-06-16 NOTE — Patient Instructions (Signed)
Your physician recommends that you return for lab work today--lipid profile/BMET.  Your physician has requested that you have an echocardiogram. Echocardiography is a painless test that uses sound waves to create images of your heart. It provides your doctor with information about the size and shape of your heart and how well your heart's chambers and valves are working. This procedure takes approximately one hour. There are no restrictions for this procedure.  Your physician wants you to follow-up in: 1 year with Dr Shirlee Latch. (November 2015). You will receive a reminder letter in the mail two months in advance. If you don't receive a letter, please call our office to schedule the follow-up appointment.

## 2013-06-16 NOTE — Progress Notes (Signed)
Patient ID: [name], male   DOB: [birth year], 67 y.o.   MRN: [id number] PCP: Dr. Jeannetta Nap  67 yo with history of CAD s/p anterolateral STEMI in 1/08 and ischemic cardiomyopathy presents for cardiology followup.  He has been seen by Dr. Gala Romney in the past but has not been seen in over 3 years.  Most recently, he had a cardiac cath with patent LAD stent in 3/11.  Last assessment of EF was by LV-gram in 3/11.  EF was 40-45%.  He has not had any chest pain.  He is short of breath after walking up 2-3 flights of steps.  No orthopnea.  About 3 months ago, he had spells where he would wake up at night short of breath.  This happened several times but then stopped.  He has not had any of these spells for the last 2 months.  He has no history of OSA.  He does not have daytime sleepiness.  He snores but not loudly.  He has not been gaining weight.  ECG: NSR, old ASMI, 1st degree AV block with PR interval 212 msec.   PMH: 1. CAD: Anterolateral STEMI in 1/08 with BMS to LAD (had luminals in LCx and RCA). Myoview in 1/10 showed EF 45% with anteroseptal infarction and mild peri-infarction ischemia.  LHC (3/11) with patent LAD stent, EF 40-45%.   2. Ischemic cardiomyopathy: EF 30% at time of STEMI.  Most recent study was LV-gram in 3/11 with EF 40-45%.   3. HTN 4. Hyperlipidemia 5. ABIs in 10/14 were normal. 6. Abdominal US in 10/14 showed no AAA.  7. Aspirin allergy: Questionable.  Had hives once after taking aspirin but has taken it since without problems.   8. Sleep study in 2011 with no OSA  SH: Married, has children, lives in [place], retired, nonsmoker.   FH: CAD  ROS: All systems reviewed and negative except as per HPI.   Current Outpatient Prescriptions  Medication Sig Dispense Refill  . amLODipine (NORVASC) 2.5 MG tablet Take 2.5 mg by mouth daily.      . benazepril-hydrochlorthiazide (LOTENSIN HCT) 20-25 MG per tablet Take 1 tablet by mouth daily.      . carvedilol (COREG) 25 MG  tablet Take 12.5 mg by mouth 2 (two) times daily with a meal.       . clopidogrel (PLAVIX) 75 MG tablet Take 75 mg by mouth daily with breakfast.      . simvastatin (ZOCOR) 80 MG tablet Take 80 mg by mouth daily.      . traZODone (DESYREL) 100 MG tablet Take 100 mg by mouth at bedtime as needed for sleep.       No current facility-administered medications for this visit.   BP 132/80  Pulse 62  Ht 6' (1.829 m)  Wt 203 lb (92.08 kg)  BMI 27.53 kg/m2 General: NAD Neck: No JVD, no thyromegaly or thyroid nodule.  Lungs: Clear to auscultation bilaterally with normal respiratory effort. CV: Nondisplaced PMI.  Heart regular S1/S2, no S3/S4, no murmur.  No peripheral edema.  No carotid bruit.  Normal pedal pulses.  Abdomen: Soft, nontender, no hepatosplenomegaly, no distention.  Skin: Intact without lesions or rashes.  Neurologic: Alert and oriented x 3.  Psych: Normal affect. Extremities: No clubbing or cyanosis.  HEENT: Normal.   Assessment/Plan: 1. CAD: No chest pain.  LAD stent was patent on cath in 3/11.  He has not been on aspirin because of a questionable aspirin allergy (had hives with aspirin  once but has taken aspirin since without problems). He will continue Plavix, statin, Coreg, ACEI.  2. Hyperlipidemia: He has been on simvastatin 80 mg daily for well over a year without apparent side effects. I will check lipids today, goal LDL < 70.  3. Ischemic cardiomyopathy: EF 40-45% on LV-gram in 3/11.  On exam, he does not appear volume overloaded.  He had spells at night 3 months ago that could be consistent with PND.  He does not have signs/symptoms of OSA (negative sleep study in 2011).  He is not having these symptoms now (NYHA class II).  I will get an echocardiogram.  He will continue Coreg and benazepril at current doses. I will also get a BMET today.  4. If echo is stable, followup in 1 year.   Marca Ancona 06/16/2013

## 2013-07-01 ENCOUNTER — Ambulatory Visit (HOSPITAL_COMMUNITY): Payer: Medicare Other | Attending: Cardiology | Admitting: Cardiology

## 2013-07-01 ENCOUNTER — Encounter: Payer: Self-pay | Admitting: Cardiology

## 2013-07-01 DIAGNOSIS — I252 Old myocardial infarction: Secondary | ICD-10-CM | POA: Insufficient documentation

## 2013-07-01 DIAGNOSIS — I77819 Aortic ectasia, unspecified site: Secondary | ICD-10-CM | POA: Insufficient documentation

## 2013-07-01 DIAGNOSIS — E785 Hyperlipidemia, unspecified: Secondary | ICD-10-CM

## 2013-07-01 DIAGNOSIS — I10 Essential (primary) hypertension: Secondary | ICD-10-CM | POA: Insufficient documentation

## 2013-07-01 DIAGNOSIS — I428 Other cardiomyopathies: Secondary | ICD-10-CM | POA: Insufficient documentation

## 2013-07-01 DIAGNOSIS — I2510 Atherosclerotic heart disease of native coronary artery without angina pectoris: Secondary | ICD-10-CM | POA: Insufficient documentation

## 2013-07-01 NOTE — Progress Notes (Signed)
Echo performed. 

## 2013-07-07 ENCOUNTER — Telehealth: Payer: Self-pay | Admitting: *Deleted

## 2013-07-07 DIAGNOSIS — I10 Essential (primary) hypertension: Secondary | ICD-10-CM

## 2013-07-07 DIAGNOSIS — I2510 Atherosclerotic heart disease of native coronary artery without angina pectoris: Secondary | ICD-10-CM

## 2013-07-07 NOTE — Telephone Encounter (Signed)
He can continue bid but will need BMET in a week.

## 2013-07-07 NOTE — Telephone Encounter (Signed)
Notes Recorded by Laurey Morale, MD on 07/06/2013 at 11:34 PM LV systolic function not changed from the past (mildly decreased) but there is some concern for LV apical thrombus. Would suggest limited repeat echo with contrast to assess for LV thrombus   07/07/13 pt agreed to return for repeat limited echo to assess for LV thrombus.  Pt mentioned that his SBP had been in the 160-170 since last office visit with Dr Shirlee Latch. On his own he had increased lisinopril/HCTZ 20/25 from qd to bid. His BP has been in the 135/85 since Friday. Pt asking if this is OK and if he should continue bid. I will forward to Dr Shirlee Latch for review.

## 2013-07-07 NOTE — Telephone Encounter (Signed)
Pt advised.

## 2013-07-14 ENCOUNTER — Other Ambulatory Visit (INDEPENDENT_AMBULATORY_CARE_PROVIDER_SITE_OTHER): Payer: Medicare Other

## 2013-07-14 DIAGNOSIS — I2510 Atherosclerotic heart disease of native coronary artery without angina pectoris: Secondary | ICD-10-CM

## 2013-07-14 DIAGNOSIS — I10 Essential (primary) hypertension: Secondary | ICD-10-CM

## 2013-07-14 LAB — BASIC METABOLIC PANEL
BUN: 15 mg/dL (ref 6–23)
CO2: 31 mEq/L (ref 19–32)
Calcium: 8.9 mg/dL (ref 8.4–10.5)
Chloride: 103 mEq/L (ref 96–112)
Creatinine, Ser: 1.2 mg/dL (ref 0.4–1.5)
GFR: 64.28 mL/min (ref >60.00)
Potassium: 3.3 mEq/L — ABNORMAL LOW (ref 3.5–5.1)

## 2013-07-15 ENCOUNTER — Telehealth: Payer: Self-pay | Admitting: Cardiology

## 2013-07-15 ENCOUNTER — Other Ambulatory Visit: Payer: Self-pay | Admitting: *Deleted

## 2013-07-15 NOTE — Telephone Encounter (Signed)
Follow Up  ° °Pt returned call//SR  °

## 2013-07-28 ENCOUNTER — Encounter: Payer: Self-pay | Admitting: Cardiology

## 2013-07-28 ENCOUNTER — Ambulatory Visit (HOSPITAL_COMMUNITY): Payer: Medicare Other | Attending: Cardiology | Admitting: Cardiology

## 2013-07-28 ENCOUNTER — Other Ambulatory Visit (HOSPITAL_COMMUNITY): Payer: Medicare Other | Admitting: Radiology

## 2013-07-28 DIAGNOSIS — R0989 Other specified symptoms and signs involving the circulatory and respiratory systems: Secondary | ICD-10-CM

## 2013-07-28 DIAGNOSIS — I2510 Atherosclerotic heart disease of native coronary artery without angina pectoris: Secondary | ICD-10-CM | POA: Insufficient documentation

## 2013-07-28 MED ORDER — PERFLUTREN PROTEIN A MICROSPH IV SUSP
0.5000 mL | Freq: Once | INTRAVENOUS | Status: AC
  Administered 2013-07-28: 0.5 mL via INTRAVENOUS

## 2013-07-28 NOTE — Progress Notes (Signed)
Limited echo with contrast performed.

## 2013-07-30 ENCOUNTER — Telehealth: Payer: Self-pay | Admitting: Cardiology

## 2013-07-30 NOTE — Telephone Encounter (Signed)
Pt was given preliminary report on his echo. Pt will receive a final call,

## 2013-07-30 NOTE — Telephone Encounter (Signed)
New Problem: ° °Pt is requesting a call back with his test results.  °

## 2013-12-02 ENCOUNTER — Encounter: Payer: Self-pay | Admitting: Internal Medicine

## 2013-12-05 ENCOUNTER — Encounter: Payer: Self-pay | Admitting: Internal Medicine

## 2014-04-25 ENCOUNTER — Encounter: Payer: Self-pay | Admitting: Internal Medicine

## 2014-06-17 ENCOUNTER — Ambulatory Visit (INDEPENDENT_AMBULATORY_CARE_PROVIDER_SITE_OTHER): Payer: Medicare Other | Admitting: Cardiology

## 2014-06-17 ENCOUNTER — Encounter: Payer: Self-pay | Admitting: Cardiology

## 2014-06-17 VITALS — BP 142/95 | HR 61 | Ht 72.0 in | Wt 204.0 lb

## 2014-06-17 DIAGNOSIS — E785 Hyperlipidemia, unspecified: Secondary | ICD-10-CM

## 2014-06-17 DIAGNOSIS — I10 Essential (primary) hypertension: Secondary | ICD-10-CM

## 2014-06-17 DIAGNOSIS — I2510 Atherosclerotic heart disease of native coronary artery without angina pectoris: Secondary | ICD-10-CM

## 2014-06-17 MED ORDER — AMLODIPINE BESYLATE 5 MG PO TABS: 5.0000 mg | ORAL_TABLET | Freq: Every day | ORAL | Status: DC

## 2014-06-17 MED ORDER — POTASSIUM CHLORIDE CRYS ER 20 MEQ PO TBCR: 20.0000 meq | EXTENDED_RELEASE_TABLET | Freq: Two times a day (BID) | ORAL | Status: DC

## 2014-06-17 NOTE — Patient Instructions (Signed)
Increase amlodipine to 5mg  daily.  Increase KCl(potassium) to 20 mEq two times a day.   Your physician has requested that you regularly monitor and record your blood pressure readings at home. Please use the same machine at the same time of day to check your readings and record them. I will call you in 2-3 weeks and get the readings.   Your physician wants you to follow-up in: 1 year with Dr Shirlee LatchMcLean. (November 2016). You will receive a reminder letter in the mail two months in advance. If you don't receive a letter, please call our office to schedule the follow-up appointment.

## 2014-06-18 NOTE — Progress Notes (Signed)
Patient ID: [name], male   DOB: 03/15/1946, 68 y.o.   MRN: [id number] PCP: Dr. Jeannetta NapElkins  68 yo with history of CAD s/p anterolateral STEMI in 1/08 and ischemic cardiomyopathy presents for cardiology followup. He had a cardiac cath with patent LAD stent in 3/11.  Echo in 12/14 showed EF 50-55% with apical septal and apical hypokinesis.  No LV thrombus.  No exertional dyspnea or chest pain.  He has been doing quite well overall. BP has been running high.   ECG: NSR, old ASMI, 1st degree AV block   Labs (11/15): LDL 87, HDL 36, HCT 45.5, K 3.4, creatinine 1.0, LFTs normal  PMH: 1. CAD: Anterolateral STEMI in 1/08 with BMS to LAD (had luminals in LCx and RCA). Myoview in 1/10 showed EF 45% with anteroseptal infarction and mild peri-infarction ischemia.  LHC (3/11) with patent LAD stent, EF 40-45%.   2. Ischemic cardiomyopathy: EF 30% at time of STEMI.  Most recent study was LV-gram in 3/11 with EF 40-45%.  Echo (12/14) showed EF 50-55%, apical septal hypokinesis an apical hypokinesis, no LV thrombus.  3. HTN 4. Hyperlipidemia 5. ABIs in 10/14 were normal. 6. Abdominal US in 10/14 showed no AAA.  7. Aspirin allergy: Questionable.  Had hives once after taking aspirin but has taken it since without problems.   8. Sleep study in 2011 with no OSA  SH: Married, has children, [place], retired, nonsmoker.   FH: CAD  ROS: All systems reviewed and negative except as per HPI.   Current Outpatient Prescriptions  Medication Sig Dispense Refill  . benazepril-hydrochlorthiazide (LOTENSIN HCT) 20-25 MG per tablet Take 1 tablet by mouth daily.     . carvedilol (COREG) 25 MG tablet Take 12.5 mg by mouth 2 (two) times daily with a meal.     . clopidogrel (PLAVIX) 75 MG tablet Take 75 mg by mouth daily with breakfast.    . simvastatin (ZOCOR) 80 MG tablet Take 80 mg by mouth daily.    . traZODone (DESYREL) 100 MG tablet Take 100 mg by mouth at bedtime as needed for sleep.    Marland Kitchen. amLODipine  (NORVASC) 5 MG tablet Take 1 tablet (5 mg total) by mouth daily. 30 tablet 11  . potassium chloride SA (K-DUR,KLOR-CON) 20 MEQ tablet Take 1 tablet (20 mEq total) by mouth 2 (two) times daily. 60 tablet 11   No current facility-administered medications for this visit.   BP 142/95 mmHg  Pulse 61  Ht 6' (1.829 m)  Wt 204 lb (92.534 kg)  BMI 27.66 kg/m2 General: NAD Neck: No JVD, no thyromegaly or thyroid nodule.  Lungs: Clear to auscultation bilaterally with normal respiratory effort. CV: Nondisplaced PMI.  Heart regular S1/S2, no S3/S4, no murmur.  No peripheral edema.  No carotid bruit.  Normal pedal pulses.  Abdomen: Soft, nontender, no hepatosplenomegaly, no distention.  Skin: Intact without lesions or rashes.  Neurologic: Alert and oriented x 3.  Psych: Normal affect. Extremities: No clubbing or cyanosis.   Assessment/Plan: 1. CAD: No chest pain.  LAD stent was patent on cath in 3/11.  He has not been on aspirin because of a questionable aspirin allergy (had hives with aspirin once but has taken aspirin since without problems). He will continue Plavix, statin, Coreg, ACEI.  2. Hyperlipidemia: He has been on simvastatin 80 mg daily for well over a year without apparent side effects. LDL not ideal but I will not chang his statin.  He needs to watch his  diet well.  3. Ischemic cardiomyopathy: EF 40-45% on LV-gram in 3/11.  Most recent echo in 12/14 showed EF 50-55% with apical septal hypokinesis.  Continue Coreg and benazepril. 4. HTN: BP is running high.  Increase amlodipine to 5 mg daily.  He will check BP daily and we will call for numbers in 2 wks.    Followup 1 year.   Marca AnconaDalton Robie Mcniel 06/18/2014

## 2014-10-28 ENCOUNTER — Emergency Department (INDEPENDENT_AMBULATORY_CARE_PROVIDER_SITE_OTHER)
Admission: EM | Admit: 2014-10-28 | Discharge: 2014-10-28 | Disposition: A | Discharge status: Home / Self Care | Payer: Medicare Other | Source: Home / Self Care | Attending: Family Medicine | Admitting: Family Medicine

## 2014-10-28 ENCOUNTER — Encounter (HOSPITAL_COMMUNITY): Payer: Self-pay

## 2014-10-28 DIAGNOSIS — J069 Acute upper respiratory infection, unspecified: Secondary | ICD-10-CM

## 2014-10-28 MED ORDER — IPRATROPIUM BROMIDE 0.06 % NA SOLN: 2.0000 | Freq: Four times a day (QID) | NASAL | Status: DC

## 2014-10-28 MED ORDER — AZITHROMYCIN 250 MG PO TABS: ORAL_TABLET | ORAL | Status: DC

## 2014-10-28 MED ORDER — HYDROCOD POLST-CHLORPHEN POLST 10-8 MG/5ML PO LQCR: 5.0000 mL | Freq: Two times a day (BID) | ORAL | Status: DC | PRN

## 2014-10-28 NOTE — ED Provider Notes (Signed)
CSN: [id number]     Arrival date & time 10/28/14  1322 History   First MD Initiated Contact with Patient 10/28/14 1355     Chief Complaint  Patient presents with  . Cough   (Consider location/radiation/quality/duration/timing/severity/associated sxs/prior Treatment) Patient is a 69 y.o. male presenting with cough. The history is provided by the patient.  Cough Cough characteristics:  Non-productive, dry and harsh Severity:  Moderate Onset quality:  Gradual Duration:  1 week Progression:  Unchanged Chronicity:  New Smoker: no   Relieved by:  None tried Worsened by:  Nothing tried Ineffective treatments:  None tried Associated symptoms: rhinorrhea   Associated symptoms: no chills, no fever, no shortness of breath, no sore throat and no wheezing     Past Medical History  Diagnosis Date  . HTN (hypertension)   . HLD (hyperlipidemia)   . CAD (coronary artery disease)   . Ischemic cardiomyopathy   . History of ST elevation myocardial infarction (STEMI)     ACUTE ANTERIOR LATERAL WITH DEVELOPING Q WAVES   Past Surgical History  Procedure Laterality Date  . Left heart cath Left 2008    with bare metal stenting    Family History  Problem Relation Age of Onset  . CAD    . Heart attack Brother   . Stroke    . Hypertension     History  Substance Use Topics  . Smoking status: Never Smoker   . Smokeless tobacco: Not on file  . Alcohol Use: No    Review of Systems  Constitutional: Negative.  Negative for fever and chills.  HENT: Positive for congestion and rhinorrhea. Negative for sore throat.   Respiratory: Positive for cough. Negative for shortness of breath and wheezing.     Allergies  Aspirin  Home Medications   Prior to Admission medications   Medication Sig Start Date End Date Taking? Authorizing Provider  amLODipine (NORVASC) 5 MG tablet Take 1 tablet (5 mg total) by mouth daily. 06/17/14  Yes Laurey Morale, MD  benazepril-hydrochlorthiazide (LOTENSIN HCT)  20-25 MG per tablet Take 1 tablet by mouth daily.  07/07/13  Yes Laurey Morale, MD  carvedilol (COREG) 25 MG tablet Take 12.5 mg by mouth 2 (two) times daily with a meal.    Yes Historical Provider, MD  clopidogrel (PLAVIX) 75 MG tablet Take 75 mg by mouth daily with breakfast.   Yes Historical Provider, MD  potassium chloride SA (K-DUR,KLOR-CON) 20 MEQ tablet Take 1 tablet (20 mEq total) by mouth 2 (two) times daily. 06/17/14  Yes Laurey Morale, MD  simvastatin (ZOCOR) 80 MG tablet Take 80 mg by mouth daily.   Yes Historical Provider, MD  traZODone (DESYREL) 100 MG tablet Take 100 mg by mouth at bedtime as needed for sleep.   Yes Historical Provider, MD  azithromycin (ZITHROMAX Z-PAK) 250 MG tablet Take as directed on pack 10/28/14   Linna Hoff, MD  chlorpheniramine-HYDROcodone [facility] PENNKINETIC ER) 10-8 MG/5ML LQCR Take 5 mLs by mouth every 12 (twelve) hours as needed for cough. 10/28/14   Linna Hoff, MD  ipratropium (ATROVENT) 0.06 % nasal spray Place 2 sprays into both nostrils 4 (four) times daily. 10/28/14   Linna Hoff, MD   BP 125/77 mmHg  Pulse 74  Temp(Src) 97.9 F (36.6 C)  Resp 16  SpO2 95% Physical Exam  Constitutional: He is oriented to person, place, and time. He appears well-developed and well-nourished. No distress.  HENT:  Head: Normocephalic.  Right Ear:  External ear normal.  Left Ear: External ear normal.  Mouth/Throat: Oropharynx is clear and moist.  Eyes: Pupils are equal, round, and reactive to light.  Neck: Normal range of motion. Neck supple.  Cardiovascular: Normal heart sounds and intact distal pulses.   Pulmonary/Chest: Effort normal and breath sounds normal. He has no wheezes. He has no rales.  Lymphadenopathy:    He has no cervical adenopathy.  Neurological: He is alert and oriented to person, place, and time.  Skin: Skin is warm and dry.  Nursing note and vitals reviewed.   ED Course  Procedures (including critical care time) Labs  Review Labs Reviewed - No data to display  Imaging Review No results found.   MDM   1. URI (upper respiratory infection)        Linna HoffJames D Kenta Laster, MD 10/28/14 1438

## 2014-10-28 NOTE — ED Notes (Signed)
Cough x 1 week

## 2014-10-28 NOTE — Discharge Instructions (Signed)
Drink plenty of fluids as discussed, use medicine as prescribed, and mucinex or delsym for cough. Return or see your doctor if further problems °

## 2015-07-28 ENCOUNTER — Encounter: Payer: Self-pay | Admitting: *Deleted

## 2015-12-14 ENCOUNTER — Telehealth: Payer: Self-pay | Admitting: Cardiology

## 2015-12-14 NOTE — Telephone Encounter (Signed)
Pt aware appt scheduled to see Jacolyn ReedyMichele Lenze, PA 12/28/15 8:15AM. Pt aware to call if symptoms do not improve or new symptoms.

## 2015-12-14 NOTE — Telephone Encounter (Signed)
Pt states he was working outside yesterday for several hours using a bush hog. Pt states he noticed both hands were swollen yesterday afternoon when he took his gloves off. Pt states both hands are swollen about the same, the left a little more than the right. Pt states the swelling in his hands has improved some this morning. Pt denies any shortness of breath, LE edema, denies feeling of general swelling or puffiness, denies chest pain. Pt notes some neck and left shoulder pain for 3 days, relieved with motrin.  Pt states he did not see any evidence of any bug bites or other injury to his hands. Pt states he was holding the handles of the bush hog for several hours, his hands were fairly immobile, he does think there is elastic around the wrists of his gloves.  Pt states his sodium intake has not increased over the past few days.     I reviewed with Tereso NewcomerScott Weaver, PA. Per Scott-keep hands moving and not in dependent position, limit sodium.  Pt to monitor symptoms, see PCP if symptoms do not continue to improve today, schedule next available appointment here.

## 2015-12-14 NOTE — Telephone Encounter (Signed)
[name] is calling because his hands swelled up inside his gloves while working in the yard , and they are still swollen . Please call  thanks

## 2015-12-28 ENCOUNTER — Encounter: Payer: Self-pay | Admitting: Physician Assistant

## 2015-12-28 ENCOUNTER — Ambulatory Visit (INDEPENDENT_AMBULATORY_CARE_PROVIDER_SITE_OTHER): Payer: Medicare Other | Admitting: Physician Assistant

## 2015-12-28 ENCOUNTER — Telehealth (HOSPITAL_COMMUNITY): Payer: Self-pay | Admitting: *Deleted

## 2015-12-28 VITALS — BP 150/100 | HR 69 | Ht 73.0 in | Wt 212.0 lb

## 2015-12-28 DIAGNOSIS — E785 Hyperlipidemia, unspecified: Secondary | ICD-10-CM | POA: Diagnosis not present

## 2015-12-28 DIAGNOSIS — I2510 Atherosclerotic heart disease of native coronary artery without angina pectoris: Secondary | ICD-10-CM

## 2015-12-28 DIAGNOSIS — R072 Precordial pain: Secondary | ICD-10-CM | POA: Diagnosis not present

## 2015-12-28 DIAGNOSIS — I10 Essential (primary) hypertension: Secondary | ICD-10-CM | POA: Diagnosis not present

## 2015-12-28 DIAGNOSIS — R002 Palpitations: Secondary | ICD-10-CM | POA: Insufficient documentation

## 2015-12-28 LAB — COMPREHENSIVE METABOLIC PANEL
ALBUMIN: 4.4 g/dL (ref 3.6–5.1)
ALT: 22 U/L (ref 9–46)
AST: 22 U/L (ref 10–35)
Alkaline Phosphatase: 62 U/L (ref 40–115)
BILIRUBIN TOTAL: 0.8 mg/dL (ref 0.2–1.2)
BUN: 15 mg/dL (ref 7–25)
CO2: 30 mmol/L (ref 20–31)
CREATININE: 1.06 mg/dL (ref 0.70–1.25)
Calcium: 9.1 mg/dL (ref 8.6–10.3)
Chloride: 101 mmol/L (ref 98–110)
Glucose, Bld: 140 mg/dL — ABNORMAL HIGH (ref 65–99)
Potassium: 3.4 mmol/L — ABNORMAL LOW (ref 3.5–5.3)
SODIUM: 140 mmol/L (ref 135–146)
Total Protein: 6.7 g/dL (ref 6.1–8.1)

## 2015-12-28 LAB — CBC WITH DIFFERENTIAL/PLATELET
BASOS PCT: 1 %
Basophils Absolute: 63 cells/uL (ref 0–200)
Eosinophils Absolute: 252 cells/uL (ref 15–500)
Eosinophils Relative: 4 %
HEMATOCRIT: 44.6 % (ref 38.5–50.0)
HEMOGLOBIN: 15.3 g/dL (ref 13.2–17.1)
LYMPHS ABS: 2016 {cells}/uL (ref 850–3900)
Lymphocytes Relative: 32 %
MCH: 30.5 pg (ref 27.0–33.0)
MCHC: 34.3 g/dL (ref 32.0–36.0)
MCV: 89 fL (ref 80.0–100.0)
MONO ABS: 819 {cells}/uL (ref 200–950)
MPV: 10 fL (ref 7.5–12.5)
Monocytes Relative: 13 %
NEUTROS ABS: 3150 {cells}/uL (ref 1500–7800)
Neutrophils Relative %: 50 %
Platelets: 230 10*3/uL (ref 140–400)
RBC: 5.01 MIL/uL (ref 4.20–5.80)
RDW: 13.3 % (ref 11.0–15.0)
WBC: 6.3 10*3/uL (ref 3.8–10.8)

## 2015-12-28 LAB — LIPID PANEL
Cholesterol: 135 mg/dL (ref 125–200)
HDL: 28 mg/dL — AB (ref >=40)
LDL CALC: 73 mg/dL (ref <130)
Total CHOL/HDL Ratio: 4.8 Ratio (ref <=5.0)
Triglycerides: 172 mg/dL — ABNORMAL HIGH (ref <150)
VLDL: 34 mg/dL — ABNORMAL HIGH (ref <30)

## 2015-12-28 LAB — TSH: TSH: 4 mIU/L (ref 0.40–4.50)

## 2015-12-28 MED ORDER — AMLODIPINE BESYLATE 5 MG PO TABS: 5.0000 mg | ORAL_TABLET | Freq: Every day | ORAL | Status: DC

## 2015-12-28 NOTE — Telephone Encounter (Signed)
Patient given detailed instructions per Myocardial Perfusion Study Information Sheet for the test on 06/01/017 at 0730. Patient notified to arrive 15 minutes early and that it is imperative to arrive on time for appointment to keep from having the test rescheduled.  If you need to cancel or reschedule your appointment, please call the office within 24 hours of your appointment. Failure to do so may result in a cancellation of your appointment, and a $50 no show fee. Patient verbalized understanding.Cherica Heiden, Adelene IdlerCynthia W

## 2015-12-28 NOTE — Patient Instructions (Addendum)
Medication Instructions:  Your physician has recommended you make the following change in your medication:  RESUME Norvasc 5 mg dailt   Labwork: Lipid, Cmet, Cbc, Tsh  Testing/Procedures: Your physician has requested that you have en exercise stress myoview. For further information please visit [email or web address]. Please follow instruction sheet, as given.   Follow-Up: Your physician recommends that you schedule a follow-up appointment after test with Dr.McLean   Any Other Special Instructions Will Be Listed Below (If Applicable).     If you need a refill on your cardiac medications before your next appointment, please call your pharmacy.  Low-Sodium Eating Plan Sodium raises blood pressure and causes water to be held in the body. Getting less sodium from food will help lower your blood pressure, reduce any swelling, and protect your heart, liver, and kidneys. We get sodium by adding salt (sodium chloride) to food. Most of our sodium comes from canned, boxed, and frozen foods. Restaurant foods, fast foods, and pizza are also very high in sodium. Even if you take medicine to lower your blood pressure or to reduce fluid in your body, getting less sodium from your food is important. WHAT IS MY PLAN? Most people should limit their sodium intake to 2,300 mg a day. Your health care provider recommends that you limit your sodium intake to ___2 grams_______ a day.  WHAT DO I NEED TO KNOW ABOUT THIS EATING PLAN? For the low-sodium eating plan, you will follow these general guidelines:  Choose foods with a % Daily Value for sodium of less than 5% (as listed on the food label).   Use salt-free seasonings or herbs instead of table salt or sea salt.   Check with your health care provider or pharmacist before using salt substitutes.   Eat fresh foods.  Eat more vegetables and fruits.  Limit canned vegetables. If you do use them, rinse them well to decrease the sodium.   Limit cheese  to 1 oz (28 g) per day.   Eat lower-sodium products, often labeled as "lower sodium" or "no salt added."  Avoid foods that contain monosodium glutamate (MSG). MSG is sometimes added to [place] food and some canned foods.  Check food labels (Nutrition Facts labels) on foods to learn how much sodium is in one serving.  Eat more home-cooked food and less restaurant, buffet, and fast food.  When eating at a restaurant, ask that your food be prepared with less salt, or no salt if possible.  HOW DO I READ FOOD LABELS FOR SODIUM INFORMATION? The Nutrition Facts label lists the amount of sodium in one serving of the food. If you eat more than one serving, you must multiply the listed amount of sodium by the number of servings. Food labels may also identify foods as:  Sodium free--Less than 5 mg in a serving.  Very low sodium--35 mg or less in a serving.  Low sodium--140 mg or less in a serving.  Light in sodium--50% less sodium in a serving. For example, if a food that usually has 300 mg of sodium is changed to become light in sodium, it will have 150 mg of sodium.  Reduced sodium--25% less sodium in a serving. For example, if a food that usually has 400 mg of sodium is changed to reduced sodium, it will have 300 mg of sodium. WHAT FOODS CAN I EAT? Grains Low-sodium cereals, including oats, puffed wheat and rice, and shredded wheat cereals. Low-sodium crackers. Unsalted rice and pasta. Lower-sodium bread.  Vegetables Frozen or fresh  vegetables. Low-sodium or reduced-sodium canned vegetables. Low-sodium or reduced-sodium tomato sauce and paste. Low-sodium or reduced-sodium tomato and vegetable juices.  Fruits Fresh, frozen, and canned fruit. Fruit juice.  Meat and Other Protein Products Low-sodium canned tuna and salmon. Fresh or frozen meat, poultry, seafood, and fish. Lamb. Unsalted nuts. Dried beans, peas, and lentils without added salt. Unsalted canned beans. Homemade soups  without salt. Eggs.  Dairy Milk. Soy milk. Ricotta cheese. Low-sodium or reduced-sodium cheeses. Yogurt.  Condiments Fresh and dried herbs and spices. Salt-free seasonings. Onion and garlic powders. Low-sodium varieties of mustard and ketchup. Fresh or refrigerated horseradish. Lemon juice.  Fats and Oils Reduced-sodium salad dressings. Unsalted butter.  Other Unsalted popcorn and pretzels.  The items listed above may not be a complete list of recommended foods or beverages. Contact your dietitian for more options. WHAT FOODS ARE NOT RECOMMENDED? Grains Instant hot cereals. Bread stuffing, pancake, and biscuit mixes. Croutons. Seasoned rice or pasta mixes. Noodle soup cups. Boxed or frozen macaroni and cheese. Self-rising flour. Regular salted crackers. Vegetables Regular canned vegetables. Regular canned tomato sauce and paste. Regular tomato and vegetable juices. Frozen vegetables in sauces. Salted [place] fries. Olives. Rosita Fire. Relishes. Sauerkraut. Salsa. Meat and Other Protein Products Salted, canned, smoked, spiced, or pickled meats, seafood, or fish. Bacon, ham, sausage, hot dogs, corned beef, chipped beef, and packaged luncheon meats. Salt pork. Jerky. Pickled herring. Anchovies, regular canned tuna, and sardines. Salted nuts. Dairy Processed cheese and cheese spreads. Cheese curds. Blue cheese and cottage cheese. Buttermilk.  Condiments Onion and garlic salt, seasoned salt, table salt, and sea salt. Canned and packaged gravies. Worcestershire sauce. Tartar sauce. Barbecue sauce. Teriyaki sauce. Soy sauce, including reduced sodium. Steak sauce. Fish sauce. Oyster sauce. Cocktail sauce. Horseradish that you find on the shelf. Regular ketchup and mustard. Meat flavorings and tenderizers. Bouillon cubes. Hot sauce. Tabasco sauce. Marinades. Taco seasonings. Relishes. Fats and Oils Regular salad dressings. Salted butter. Margarine. Ghee. Bacon fat.  Other Potato and tortilla  chips. Corn chips and puffs. Salted popcorn and pretzels. Canned or dried soups. Pizza. Frozen entrees and pot pies.  The items listed above may not be a complete list of foods and beverages to avoid. Contact your dietitian for more information.   This information is not intended to replace advice given to you by your health care provider. Make sure you discuss any questions you have with your health care provider.   Document Released: 01/06/2002 Document Revised: 08/07/2014 Document Reviewed: 05/21/2013 Elsevier Interactive Patient Education [employer]

## 2015-12-28 NOTE — Progress Notes (Signed)
Cardiology Office Note    Date:  12/28/2015   ID:  [name], DOB 10/12/1945, MRN [id number]  PCP:  Lenn Sink Dr. Diona Foley  Cardiologist:  Dr. Shirlee Latch     History of Present Illness:  [name] is a 70 y.o. male with history of CAD s/p anterolateral STEMI in 1/08 and ischemic cardiomyopathy presents for cardiology followup. He had a cardiac cath with patent LAD stent in 3/11.  Echo in 12/14 showed EF 50-55% with apical septal and apical hypokinesis.  No LV thrombus.  Worked at [employer] doing heavy lifting. Was having a lot of chest tightness and discomfort so he quit working 2 months ago. He gardens, mows, and walks without chest tightness. Only heavy lifting causes chest tightness relieved with NTG. Also complained of hand swelling when using a bush hog which resolved. [facility] to 10 mg daily for HTN and it caused leg edema so he stopped it completely. Says BP runs 140/70 at home.  Occasionally awakens with a racing heart. Wakes up and deep breathing resolves it. Only drinks 2 cups of coffee in am. Last time 2 months ago. Only happens rarely.      Past Medical History  Diagnosis Date  . HTN (hypertension)   . HLD (hyperlipidemia)   . CAD (coronary artery disease)   . Ischemic cardiomyopathy   . History of ST elevation myocardial infarction (STEMI)     ACUTE ANTERIOR LATERAL WITH DEVELOPING Q WAVES    Past Surgical History  Procedure Laterality Date  . Left heart cath Left 2008    with bare metal stenting     Current Medications: Outpatient Prescriptions Prior to Visit  Medication Sig Dispense Refill  . carvedilol (COREG) 25 MG tablet Take 12.5 mg by mouth 2 (two) times daily with a meal.     . clopidogrel (PLAVIX) 75 MG tablet Take 75 mg by mouth daily with breakfast.    . potassium chloride SA (K-DUR,KLOR-CON) 20 MEQ tablet Take 1 tablet (20 mEq total) by mouth 2 (two) times daily. 60 tablet 11  . simvastatin (ZOCOR) 80 MG tablet Take 80  mg by mouth daily.    . benazepril-hydrochlorthiazide (LOTENSIN HCT) 20-25 MG per tablet Take 1 tablet by mouth daily.     Marland Kitchen ipratropium (ATROVENT) 0.06 % nasal spray Place 2 sprays into both nostrils 4 (four) times daily. 15 mL 1  . amLODipine (NORVASC) 5 MG tablet Take 1 tablet (5 mg total) by mouth daily. 30 tablet 11  . azithromycin (ZITHROMAX Z-PAK) 250 MG tablet Take as directed on pack 6 tablet 0  . chlorpheniramine-HYDROcodone (TUSSIONEX PENNKINETIC ER) 10-8 MG/5ML LQCR Take 5 mLs by mouth every 12 (twelve) hours as needed for cough. 115 mL 0  . traZODone (DESYREL) 100 MG tablet Take 100 mg by mouth at bedtime as needed for sleep.     No facility-administered medications prior to visit.     Allergies:   Aspirin   Social History   Social History  . Marital Status: Married    Spouse Name: N/A  . Number of Children: N/A  . Years of Education: N/A   Social History Main Topics  . Smoking status: Never Smoker   . Smokeless tobacco: None  . Alcohol Use: No  . Drug Use: No  . Sexual Activity: Not Asked   Other Topics Concern  . None   Social History Narrative     Family History:  The patient's family history includes  Heart attack in his brother.   ROS:   Please see the history of present illness.    Review of Systems  Constitution: Negative.  HENT: Negative.   Cardiovascular: Positive for chest pain, dyspnea on exertion, palpitations and paroxysmal nocturnal dyspnea.  Respiratory: Positive for shortness of breath.   Endocrine: Negative.   Hematologic/Lymphatic: Bruises/bleeds easily.  Musculoskeletal: Positive for back pain, joint swelling and muscle weakness.  Gastrointestinal: Negative.   Genitourinary: Negative.   Neurological: Negative.    All other systems reviewed and are negative.   PHYSICAL EXAM:   VS:  BP 150/100 mmHg  Pulse 69  Ht  (1.854 m)  Wt 212 lb (96.163 kg)  BMI 27.98 kg/m2   GEN: Well nourished, well developed, in no acute  distress Neck: no JVD, carotid bruits, or masses Cardiac:  RRR; no murmurs, rubs, or gallops,no edema  Respiratory:  clear to auscultation bilaterally, normal work of breathing GI: soft, nontender, nondistended, + BS MS: no deformity or atrophy Skin: warm and dry, no rash Neuro:  Alert and Oriented x 3, Strength and sensation are intact Psych: euthymic mood, full affect  Wt Readings from Last 3 Encounters:  12/28/15 212 lb (96.163 kg)  06/17/14 204 lb (92.534 kg)  06/16/13 203 lb (92.08 kg)      Studies/Labs Reviewed:   EKG:  EKG is  ordered today.  The ekg ordered today demonstrates Normal sinus rhythm with first-degree AV block PVCs, old anterior infarct  Recent Labs: No results found for requested labs within last 365 days.   Lipid Panel    Component Value Date/Time   CHOL 138 06/16/2013 1127   TRIG 166.0* 06/16/2013 1127   HDL 29.50* 06/16/2013 1127   CHOLHDL 5 06/16/2013 1127   VLDL 33.2 06/16/2013 1127   LDLCALC 75 06/16/2013 1127    Additional studies/ records that were reviewed today include:  2-D echo 07/28/13 Study Conclusions  Impressions: Limited study. Distal septal and apical wall hypokinesis Overall EF likely 50-55%  No mural apical thrombus Impressions:  - Limited study. Distal septal and apical wall hypokinesis   Overall EF likely 50-55%   No mural apical thrombus     ASSESSMENT:    1. Essential hypertension, benign   2. Atherosclerosis of native coronary artery of native heart without angina pectoris   3. Hyperlipidemia   4. CHEST PAIN, PRECORDIAL   5. Palpitations      PLAN:  In order of problems listed above: Hypertension :Patient's blood pressure is quite high. He stopped Norvasc when the [facility] increase to 10 mg daily because it causes leg swelling. He also gets a fair amount of sodium in his diet. We'll resume Norvasc at 5 mg once daily, 2 g sodium diet.  CAD patient has history of coronary artery disease as described above. He's  had recent increase in angina with heavy lifting which prompted him to quit his job. Will order exercise Myoview to rule out ischemia  Hyperlipidemia patient is on Zocor. He has not had lipid panel or lab work in a long time. We'll check today  Palpitations patient has long history of palpitations that awaken him from sleep. They're actually better then in the past. The last episode occurred 2 months ago. He is having PVCs on his EKG. We'll check labs to make sure his potassium is normal.   Medication Adjustments/Labs and Tests Ordered: Current medicines are reviewed at length with the patient today.  Concerns regarding medicines are outlined above.  Medication changes,  Labs and Tests ordered today are listed in the Patient Instructions below. Patient Instructions  Medication Instructions:  Your physician has recommended you make the following change in your medication:  RESUME Norvasc 5 mg dailt   Labwork: Lipid, Cmet, Cbc, Tsh  Testing/Procedures: Your physician has requested that you have en exercise stress myoview. For further information please visit [email or web address]. Please follow instruction sheet, as given.   Follow-Up: Your physician recommends that you schedule a follow-up appointment after test with Dr.McLean   Any Other Special Instructions Will Be Listed Below (If Applicable).     If you need a refill on your cardiac medications before your next appointment, please call your pharmacy.       Elson ClanSigned, Michele Lenze, PA-C  12/28/2015 8:36 AM    [facility] [address], [place], [place]  [address] Phone: [phone]; Fax: [phone]

## 2015-12-29 ENCOUNTER — Telehealth: Payer: Self-pay | Admitting: *Deleted

## 2015-12-29 DIAGNOSIS — I2510 Atherosclerotic heart disease of native coronary artery without angina pectoris: Secondary | ICD-10-CM

## 2015-12-29 DIAGNOSIS — I10 Essential (primary) hypertension: Secondary | ICD-10-CM

## 2015-12-29 MED ORDER — POTASSIUM CHLORIDE CRYS ER 20 MEQ PO TBCR: EXTENDED_RELEASE_TABLET | ORAL | Status: DC

## 2015-12-29 NOTE — Telephone Encounter (Signed)
Spoke with pt, he states that he has only been taking 1 potassium a day, for a couple of years, that the [facility] changed it a couple of years ago due to his potassium being too high.  So he was advised to continue with the current plan that we discussed earlier, to take 3 20 meq potassium today and resume back to 1 tablet tomorrow. Pt verbalized understanding.

## 2015-12-29 NOTE — Telephone Encounter (Signed)
-----   Message from Dyann KiefMichele M Lenze, PA-C sent at 12/29/2015  7:44 AM EDT ----- Potassium low. Take 2 extra kdur 20 meq today in addition to his daily dose. Triglycerides high. Reduce sugar in his diet. Glucose also high. Should be checked by primary for diabetes

## 2015-12-29 NOTE — Telephone Encounter (Signed)
Spoke with pt.  See addendum on phone note

## 2015-12-29 NOTE — Telephone Encounter (Signed)
Pt has been made aware of his lab results.  He has been made advised to take 2 extra potassiums today and go back to regular dose tomorrow, 12/30/15.  Pt also made aware that his triglycerides and glucose was high.  He was advised to lower the sugar in his diet and to follow up with his pcp re: sugar. Pt verbalized understanding.

## 2015-12-29 NOTE — Telephone Encounter (Signed)
Called pt back, per [name], PA-C, to see exactly how pt has been taking potassium.  Wife will have pt call me back.

## 2015-12-30 ENCOUNTER — Ambulatory Visit (HOSPITAL_COMMUNITY): Payer: Medicare Other | Attending: Cardiology

## 2015-12-30 DIAGNOSIS — R9439 Abnormal result of other cardiovascular function study: Secondary | ICD-10-CM | POA: Insufficient documentation

## 2015-12-30 DIAGNOSIS — R072 Precordial pain: Secondary | ICD-10-CM | POA: Diagnosis present

## 2015-12-30 DIAGNOSIS — I119 Hypertensive heart disease without heart failure: Secondary | ICD-10-CM | POA: Insufficient documentation

## 2015-12-30 DIAGNOSIS — R002 Palpitations: Secondary | ICD-10-CM | POA: Diagnosis not present

## 2015-12-30 LAB — MYOCARDIAL PERFUSION IMAGING
CHL CUP NUCLEAR SDS: 3
CHL CUP NUCLEAR SSS: 12
CSEPED: 6 min
CSEPEW: 7 METS
CSEPHR: 90 %
Exercise duration (sec): 0 s
LHR: 0.27
LV dias vol: 139 mL (ref 62–150)
LV sys vol: 89 mL
MPHR: 152 {beats}/min
Peak HR: 137 {beats}/min
Rest HR: 62 {beats}/min
SRS: 9
TID: 0.98

## 2015-12-30 MED ORDER — TECHNETIUM TC 99M TETROFOSMIN IV KIT
32.5000 | PACK | Freq: Once | INTRAVENOUS | Status: AC | PRN
  Administered 2015-12-30: 33 via INTRAVENOUS
  Filled 2015-12-30: qty 33

## 2015-12-30 MED ORDER — TECHNETIUM TC 99M TETROFOSMIN IV KIT
10.2000 | PACK | Freq: Once | INTRAVENOUS | Status: AC | PRN
  Administered 2015-12-30: 10 via INTRAVENOUS
  Filled 2015-12-30: qty 10

## 2016-01-04 ENCOUNTER — Other Ambulatory Visit: Payer: Self-pay | Admitting: *Deleted

## 2016-01-04 DIAGNOSIS — E785 Hyperlipidemia, unspecified: Secondary | ICD-10-CM

## 2016-01-04 DIAGNOSIS — I519 Heart disease, unspecified: Secondary | ICD-10-CM

## 2016-01-04 DIAGNOSIS — R002 Palpitations: Secondary | ICD-10-CM

## 2016-01-04 DIAGNOSIS — I2510 Atherosclerotic heart disease of native coronary artery without angina pectoris: Secondary | ICD-10-CM

## 2016-01-04 DIAGNOSIS — R072 Precordial pain: Secondary | ICD-10-CM

## 2016-01-04 DIAGNOSIS — I10 Essential (primary) hypertension: Secondary | ICD-10-CM

## 2016-01-21 ENCOUNTER — Ambulatory Visit (HOSPITAL_COMMUNITY): Payer: Medicare Other | Attending: Cardiovascular Disease

## 2016-01-21 ENCOUNTER — Other Ambulatory Visit: Payer: Self-pay

## 2016-01-21 DIAGNOSIS — E785 Hyperlipidemia, unspecified: Secondary | ICD-10-CM | POA: Diagnosis not present

## 2016-01-21 DIAGNOSIS — I340 Nonrheumatic mitral (valve) insufficiency: Secondary | ICD-10-CM | POA: Diagnosis not present

## 2016-01-21 DIAGNOSIS — R072 Precordial pain: Secondary | ICD-10-CM | POA: Diagnosis not present

## 2016-01-21 DIAGNOSIS — I10 Essential (primary) hypertension: Secondary | ICD-10-CM | POA: Diagnosis not present

## 2016-01-21 DIAGNOSIS — I2510 Atherosclerotic heart disease of native coronary artery without angina pectoris: Secondary | ICD-10-CM | POA: Diagnosis not present

## 2016-01-21 DIAGNOSIS — R29898 Other symptoms and signs involving the musculoskeletal system: Secondary | ICD-10-CM | POA: Insufficient documentation

## 2016-01-21 DIAGNOSIS — R002 Palpitations: Secondary | ICD-10-CM | POA: Insufficient documentation

## 2016-01-21 DIAGNOSIS — I519 Heart disease, unspecified: Secondary | ICD-10-CM | POA: Diagnosis not present

## 2016-01-21 DIAGNOSIS — I119 Hypertensive heart disease without heart failure: Secondary | ICD-10-CM | POA: Insufficient documentation

## 2016-01-21 LAB — ECHOCARDIOGRAM COMPLETE
AOASC: 40 cm
CHL CUP DOP CALC LVOT VTI: 20.6 cm
CHL CUP MV DEC (S): 282
CHL CUP TV REG PEAK VELOCITY: 258 cm/s
E/e' ratio: 6.64
EWDT: 282 ms
FS: 27 % — AB (ref 28–44)
IVS/LV PW RATIO, ED: 1.29
LA diam index: 1.61 cm/m2
LASIZE: 36 mm
LAVOL: 71.3 mL
LAVOLA4C: 61.4 mL
LAVOLIN: 31.8 mL/m2
LEFT ATRIUM END SYS DIAM: 36 mm
LV E/e'average: 6.64
LV e' LATERAL: 6.81 cm/s
LVEEMED: 6.64
LVOT SV: 71 mL
LVOT area: 3.46 cm2
LVOT peak vel: 89.8 cm/s
LVOTD: 21 mm
Lateral S' vel: 10.1 cm/s
MV pk A vel: 70.6 m/s
MVPKEVEL: 45.2 m/s
PV Reg grad dias: 6 mmHg
PV Reg vel dias: 126 cm/s
PW: 9.63 mm — AB (ref 0.6–1.1)
RV sys press: 35 mmHg
TAPSE: 23.9 mm
TDI e' lateral: 6.81
TDI e' medial: 4.46
TR max vel: 258 cm/s

## 2016-01-25 ENCOUNTER — Telehealth: Payer: Self-pay | Admitting: *Deleted

## 2016-01-25 ENCOUNTER — Ambulatory Visit (HOSPITAL_COMMUNITY)
Admission: RE | Admit: 2016-01-25 | Discharge: 2016-01-25 | Disposition: A | Discharge status: Home / Self Care | Payer: Medicare Other | Source: Ambulatory Visit | Attending: Cardiology | Admitting: Cardiology

## 2016-01-25 ENCOUNTER — Encounter (HOSPITAL_COMMUNITY): Payer: Self-pay

## 2016-01-25 ENCOUNTER — Other Ambulatory Visit (HOSPITAL_COMMUNITY): Payer: Self-pay

## 2016-01-25 VITALS — BP 150/90 | HR 64 | Ht 73.0 in | Wt 211.0 lb

## 2016-01-25 DIAGNOSIS — I2510 Atherosclerotic heart disease of native coronary artery without angina pectoris: Secondary | ICD-10-CM

## 2016-01-25 DIAGNOSIS — R002 Palpitations: Secondary | ICD-10-CM | POA: Diagnosis not present

## 2016-01-25 DIAGNOSIS — R072 Precordial pain: Secondary | ICD-10-CM

## 2016-01-25 DIAGNOSIS — R5383 Other fatigue: Secondary | ICD-10-CM | POA: Insufficient documentation

## 2016-01-25 DIAGNOSIS — I255 Ischemic cardiomyopathy: Secondary | ICD-10-CM | POA: Diagnosis not present

## 2016-01-25 DIAGNOSIS — I252 Old myocardial infarction: Secondary | ICD-10-CM | POA: Insufficient documentation

## 2016-01-25 DIAGNOSIS — E785 Hyperlipidemia, unspecified: Secondary | ICD-10-CM | POA: Diagnosis not present

## 2016-01-25 DIAGNOSIS — Z886 Allergy status to analgesic agent status: Secondary | ICD-10-CM | POA: Diagnosis not present

## 2016-01-25 DIAGNOSIS — R0602 Shortness of breath: Secondary | ICD-10-CM | POA: Diagnosis not present

## 2016-01-25 DIAGNOSIS — Z955 Presence of coronary angioplasty implant and graft: Secondary | ICD-10-CM | POA: Diagnosis not present

## 2016-01-25 DIAGNOSIS — I5022 Chronic systolic (congestive) heart failure: Secondary | ICD-10-CM | POA: Insufficient documentation

## 2016-01-25 DIAGNOSIS — I10 Essential (primary) hypertension: Secondary | ICD-10-CM

## 2016-01-25 DIAGNOSIS — Z7902 Long term (current) use of antithrombotics/antiplatelets: Secondary | ICD-10-CM | POA: Insufficient documentation

## 2016-01-25 DIAGNOSIS — I509 Heart failure, unspecified: Secondary | ICD-10-CM

## 2016-01-25 DIAGNOSIS — R079 Chest pain, unspecified: Secondary | ICD-10-CM | POA: Diagnosis not present

## 2016-01-25 DIAGNOSIS — I110 Hypertensive heart disease with heart failure: Secondary | ICD-10-CM | POA: Insufficient documentation

## 2016-01-25 DIAGNOSIS — Z8249 Family history of ischemic heart disease and other diseases of the circulatory system: Secondary | ICD-10-CM | POA: Insufficient documentation

## 2016-01-25 LAB — COMPREHENSIVE METABOLIC PANEL
ALBUMIN: 4 g/dL (ref 3.5–5.0)
ALT: 23 U/L (ref 17–63)
AST: 24 U/L (ref 15–41)
Alkaline Phosphatase: 53 U/L (ref 38–126)
Anion gap: 7 (ref 5–15)
BUN: 11 mg/dL (ref 6–20)
CHLORIDE: 103 mmol/L (ref 101–111)
CO2: 27 mmol/L (ref 22–32)
CREATININE: 1.18 mg/dL (ref 0.61–1.24)
Calcium: 9.2 mg/dL (ref 8.9–10.3)
GFR calc Af Amer: >60 mL/min (ref >60)
GFR calc non Af Amer: >60 mL/min (ref >60)
GLUCOSE: 116 mg/dL — AB (ref 65–99)
Potassium: 3.2 mmol/L — ABNORMAL LOW (ref 3.5–5.1)
SODIUM: 137 mmol/L (ref 135–145)
Total Bilirubin: 0.6 mg/dL (ref 0.3–1.2)
Total Protein: 6.7 g/dL (ref 6.5–8.1)

## 2016-01-25 LAB — PROTIME-INR
INR: 1.09 (ref 0.00–1.49)
Prothrombin Time: 14.3 seconds (ref 11.6–15.2)

## 2016-01-25 LAB — BRAIN NATRIURETIC PEPTIDE: B Natriuretic Peptide: 80.6 pg/mL (ref 0.0–100.0)

## 2016-01-25 MED ORDER — SACUBITRIL-VALSARTAN 24-26 MG PO TABS: 1.0000 | ORAL_TABLET | Freq: Two times a day (BID) | ORAL | Status: DC

## 2016-01-25 NOTE — Progress Notes (Signed)
Patient ID: [name], male   DOB: 08/26/1945, 70 y.o.   MRN: [id number] PCP: Dr. Jeannetta NapElkins  70 yo with history of CAD s/p anterolateral STEMI in 1/08 and ischemic cardiomyopathy presents for cardiology followup. He had a cardiac cath with patent LAD stent in 3/11.  Echo in 12/14 showed EF 50-55% with apical septal and apical hypokinesis.    In 5/17, patient was seen by [name] for chest pain evaluation.  He was noting chest tightness when carrying a heavy load or exerting himself moderately to heavily.  In fact, he ended up retiring due to the exertional chest pain.  He does not get chest pain with lighter activity like walking on flat ground.  He can walk 1-2 miles without problems.  He is short of breath walking up a flight of steps but does not get chest pain.  He has generalized fatigue that is new.  At times, he notes palpitations while lying in bed at night.   Echo was done, showing EF down to 35-40%. Cardiolite was done, showing EF 36% with large apical infarction.    Labs (11/15): LDL 87, HDL 36, HCT 45.5, K 3.4, creatinine 1.0, LFTs normal Labs (5/17): LDL 73, HDL 28, TSH normal, K 3.4, creatinine 1.06, HCT 44.6  PMH: 1. CAD: Anterolateral STEMI in 1/08 with BMS to LAD (had luminals in LCx and RCA). Myoview in 1/10 showed EF 45% with anteroseptal infarction and mild peri-infarction ischemia.  LHC (3/11) with patent LAD stent, EF 40-45%.  - ETT-Cardiolite (6/17) with EF 36%, 6 min exercise, hypertensive BP response, large apical infarct.   2. Ischemic cardiomyopathy: EF 30% at time of STEMI.  Most recent study was LV-gram in 3/11 with EF 40-45%.  Echo (12/14) showed EF 50-55%, apical septal hypokinesis an apical hypokinesis, no LV thrombus.  - Echo (6/17) with EF 35-40%, septal/apical akinesis, moderate LV dilation, mild MR.  3. HTN 4. Hyperlipidemia 5. ABIs in 10/14 were normal. 6. Abdominal US in 10/14 showed no AAA.  7. Aspirin allergy: Questionable.  Had hives once after taking  aspirin but has taken it since without problems.   8. Sleep study in 2011 with no OSA  SH: Married, has children, [place], retired, nonsmoker.   FH: CAD  ROS: All systems reviewed and negative except as per HPI.   Current Outpatient Prescriptions  Medication Sig Dispense Refill  . amLODipine (NORVASC) 5 MG tablet Take 1 tablet (5 mg total) by mouth daily. 90 tablet 3  . carvedilol (COREG) 25 MG tablet Take 12.5 mg by mouth 2 (two) times daily with a meal.     . clopidogrel (PLAVIX) 75 MG tablet Take 75 mg by mouth daily with breakfast.    . lisinopril-hydrochlorothiazide (PRINZIDE,ZESTORETIC) 20-25 MG tablet Take 1 tablet by mouth daily.    . potassium chloride SA (K-DUR,KLOR-CON) 20 MEQ tablet Take 3 tablets by mouth today 12/29/15 only then go back to 1 tablet by mouth daily after (Patient taking differently: Take 20 mEq by mouth daily. ) 60 tablet 11  . simvastatin (ZOCOR) 80 MG tablet Take 40 mg by mouth daily.     . sacubitril-valsartan (ENTRESTO) 24-26 MG Take 1 tablet by mouth 2 (two) times daily. 60 tablet 6   No current facility-administered medications for this encounter.   BP 150/90 mmHg  Pulse 64  Ht 6\' 1"  (1.854 m)  Wt 211 lb (95.709 kg)  BMI 27.84 kg/m2  SpO2 97% General: NAD Neck: No JVD, no  thyromegaly or thyroid nodule.  Lungs: Clear to auscultation bilaterally with normal respiratory effort. CV: Nondisplaced PMI.  Heart regular S1/S2, no S3/S4, no murmur.  No peripheral edema.  No carotid bruit.  Normal pedal pulses.  Abdomen: Soft, nontender, no hepatosplenomegaly, no distention.  Skin: Intact without lesions or rashes.  Neurologic: Alert and oriented x 3.  Psych: Normal affect. Extremities: No clubbing or cyanosis.   Assessment/Plan: 1. CAD: LAD stent was patent on cath in 3/11.  He has not been on aspirin because of a questionable aspirin allergy (had hives with aspirin once but has taken aspirin since without problems).  Over the last  several months, he has developed chest pain with moderate-heavy exertion as well as prominent fatigue and dyspnea.  EF is lower by echo and Cardiolite showed large area of infarction.  - He will continue Plavix, statin, Coreg, ACEI.  - I think that he needs coronary angiography.  We discussed this today.  After hearing risks/benefits, he agreed to the procedure.  Will arrange for later this week.  2. Hyperlipidemia: He has been on simvastatin 80 mg daily for well over a year without apparent side effects. Good LDL in 5/17.  3. Ischemic cardiomyopathy: EF 35-40%, this is lower than in the past.  He does not appear volume overloaded on exam. NYHA class II symptoms. - Continue Coreg 12.5 mg bid.  - Add Entresto 24/26 bid.  BMET in 2 wks.  4. HTN: BP is running high.  Continue Coreg and amlodipine, will add Entresto 24/26 bid.  Followup in 2 wks.   Marca AnconaDalton Elford Evilsizer 01/25/2016

## 2016-01-25 NOTE — Telephone Encounter (Signed)
K+ level 3.2 today . Called pt he said that has been taking only K-dur 20 mEq once a day instead of twice a day as scheduled.  Per Dr Elease HashimotoNahser : pt to take K-dur 20 mEq twice  Day. Pt to have rechecked labs in 2 weeks. Pt state that  he would like to see Dr. Shirlee LatchMclean instead of coming for labs in 2 weeks.

## 2016-01-25 NOTE — Telephone Encounter (Signed)
Agree with KCl 20 bid.  He should be scheduled to see me in 2 wks.

## 2016-01-25 NOTE — Patient Instructions (Signed)
START Entresto 24/26 mg tablet twice daily.  You have been scheduled for a heart cath. See instruction sheet for additional details.  Do the following things EVERYDAY: 1) Weigh yourself in the morning before breakfast. Write it down and keep it in a log. 2) Take your medicines as prescribed 3) Eat low salt foods-Limit salt (sodium) to 2000 mg per day.  4) Stay as active as you can everyday 5) Limit all fluids for the day to less than 2 liters

## 2016-01-27 MED ORDER — POTASSIUM CHLORIDE CRYS ER 20 MEQ PO TBCR: 20.0000 meq | EXTENDED_RELEASE_TABLET | Freq: Two times a day (BID) | ORAL | Status: DC

## 2016-01-27 NOTE — Telephone Encounter (Signed)
Patient aware of lab results New rx sent into pharm, follow up 7/17

## 2016-01-27 NOTE — Addendum Note (Signed)
Addended by: Theresia BoughJEFFRIES, Farzana Koci M on: 01/27/2016 02:11 PM   Modules accepted: Orders

## 2016-01-28 ENCOUNTER — Encounter (HOSPITAL_COMMUNITY)
Admission: RE | Disposition: A | Discharge status: Home / Self Care | Payer: Self-pay | Source: Ambulatory Visit | Attending: Cardiology

## 2016-01-28 ENCOUNTER — Ambulatory Visit (HOSPITAL_COMMUNITY)
Admission: RE | Admit: 2016-01-28 | Discharge: 2016-01-28 | Disposition: A | Discharge status: Home / Self Care | Payer: Medicare Other | Source: Ambulatory Visit | Attending: Cardiology | Admitting: Cardiology

## 2016-01-28 DIAGNOSIS — E785 Hyperlipidemia, unspecified: Secondary | ICD-10-CM | POA: Diagnosis not present

## 2016-01-28 DIAGNOSIS — I509 Heart failure, unspecified: Secondary | ICD-10-CM | POA: Insufficient documentation

## 2016-01-28 DIAGNOSIS — I252 Old myocardial infarction: Secondary | ICD-10-CM | POA: Insufficient documentation

## 2016-01-28 DIAGNOSIS — I110 Hypertensive heart disease with heart failure: Secondary | ICD-10-CM | POA: Diagnosis not present

## 2016-01-28 DIAGNOSIS — Z8249 Family history of ischemic heart disease and other diseases of the circulatory system: Secondary | ICD-10-CM | POA: Diagnosis not present

## 2016-01-28 DIAGNOSIS — Z7902 Long term (current) use of antithrombotics/antiplatelets: Secondary | ICD-10-CM | POA: Diagnosis not present

## 2016-01-28 DIAGNOSIS — Z955 Presence of coronary angioplasty implant and graft: Secondary | ICD-10-CM | POA: Insufficient documentation

## 2016-01-28 DIAGNOSIS — I2510 Atherosclerotic heart disease of native coronary artery without angina pectoris: Secondary | ICD-10-CM | POA: Diagnosis not present

## 2016-01-28 DIAGNOSIS — I255 Ischemic cardiomyopathy: Secondary | ICD-10-CM | POA: Diagnosis not present

## 2016-01-28 DIAGNOSIS — R0609 Other forms of dyspnea: Secondary | ICD-10-CM | POA: Diagnosis present

## 2016-01-28 LAB — CBC
HEMATOCRIT: 44.7 % (ref 39.0–52.0)
HEMOGLOBIN: 15.8 g/dL (ref 13.0–17.0)
MCH: 30.6 pg (ref 26.0–34.0)
MCHC: 35.3 g/dL (ref 30.0–36.0)
MCV: 86.6 fL (ref 78.0–100.0)
Platelets: 188 10*3/uL (ref 150–400)
RBC: 5.16 MIL/uL (ref 4.22–5.81)
RDW: 12.2 % (ref 11.5–15.5)
WBC: 6.1 10*3/uL (ref 4.0–10.5)

## 2016-01-28 LAB — POTASSIUM: Potassium: 3.1 mmol/L — ABNORMAL LOW (ref 3.5–5.1)

## 2016-01-28 SURGERY — CORONARY ANGIOGRAM

## 2016-01-28 MED ORDER — SODIUM CHLORIDE 0.9 % IV SOLN: 250.0000 mL | INTRAVENOUS | Status: DC | PRN

## 2016-01-28 MED ORDER — CLOPIDOGREL BISULFATE 75 MG PO TABS: 75.0000 mg | ORAL_TABLET | ORAL | Status: DC

## 2016-01-28 MED ORDER — SODIUM CHLORIDE 0.9% FLUSH: 3.0000 mL | INTRAVENOUS | Status: DC | PRN

## 2016-01-28 MED ORDER — MIDAZOLAM HCL 2 MG/2ML IJ SOLN
INTRAMUSCULAR | Status: DC | PRN
  Administered 2016-01-28: 1 mg via INTRAVENOUS

## 2016-01-28 MED ORDER — SODIUM CHLORIDE 0.9% FLUSH: 3.0000 mL | Freq: Two times a day (BID) | INTRAVENOUS | Status: DC

## 2016-01-28 MED ORDER — ACETAMINOPHEN 325 MG PO TABS: 650.0000 mg | ORAL_TABLET | ORAL | Status: DC | PRN

## 2016-01-28 MED ORDER — HEPARIN (PORCINE) IN NACL 2-0.9 UNIT/ML-% IJ SOLN
INTRAMUSCULAR | Status: AC
  Filled 2016-01-28: qty 1000

## 2016-01-28 MED ORDER — HEPARIN SODIUM (PORCINE) 1000 UNIT/ML IJ SOLN
INTRAMUSCULAR | Status: AC
  Filled 2016-01-28: qty 1

## 2016-01-28 MED ORDER — HEPARIN SODIUM (PORCINE) 1000 UNIT/ML IJ SOLN
INTRAMUSCULAR | Status: DC | PRN
  Administered 2016-01-28: 4500 [IU] via INTRAVENOUS

## 2016-01-28 MED ORDER — POTASSIUM CHLORIDE CRYS ER 20 MEQ PO TBCR
EXTENDED_RELEASE_TABLET | ORAL | Status: AC
  Filled 2016-01-28: qty 2

## 2016-01-28 MED ORDER — FENTANYL CITRATE (PF) 100 MCG/2ML IJ SOLN
INTRAMUSCULAR | Status: DC | PRN
  Administered 2016-01-28: 25 ug via INTRAVENOUS

## 2016-01-28 MED ORDER — VERAPAMIL HCL 2.5 MG/ML IV SOLN
INTRAVENOUS | Status: DC | PRN
  Administered 2016-01-28: 10 mL via INTRA_ARTERIAL

## 2016-01-28 MED ORDER — HEPARIN (PORCINE) IN NACL 2-0.9 UNIT/ML-% IJ SOLN
INTRAMUSCULAR | Status: DC | PRN
  Administered 2016-01-28: 1000 mL

## 2016-01-28 MED ORDER — IOPAMIDOL (ISOVUE-370) INJECTION 76%
INTRAVENOUS | Status: DC | PRN
  Administered 2016-01-28: 95 mL via INTRA_ARTERIAL

## 2016-01-28 MED ORDER — VERAPAMIL HCL 2.5 MG/ML IV SOLN
INTRAVENOUS | Status: AC
  Filled 2016-01-28: qty 2

## 2016-01-28 MED ORDER — LIDOCAINE HCL (PF) 1 % IJ SOLN
INTRAMUSCULAR | Status: AC
  Filled 2016-01-28: qty 30

## 2016-01-28 MED ORDER — FENTANYL CITRATE (PF) 100 MCG/2ML IJ SOLN
INTRAMUSCULAR | Status: AC
  Filled 2016-01-28: qty 2

## 2016-01-28 MED ORDER — MIDAZOLAM HCL 2 MG/2ML IJ SOLN
INTRAMUSCULAR | Status: AC
  Filled 2016-01-28: qty 2

## 2016-01-28 MED ORDER — IOPAMIDOL (ISOVUE-370) INJECTION 76%
INTRAVENOUS | Status: AC
  Filled 2016-01-28: qty 100

## 2016-01-28 MED ORDER — LIDOCAINE HCL (PF) 1 % IJ SOLN
INTRAMUSCULAR | Status: DC | PRN
  Administered 2016-01-28: 3 mL

## 2016-01-28 MED ORDER — SODIUM CHLORIDE 0.9% FLUSH
3.0000 mL | Freq: Two times a day (BID) | INTRAVENOUS | Status: DC
Start: 2016-01-28 — End: 2016-01-28

## 2016-01-28 MED ORDER — HEPARIN (PORCINE) IN NACL 2-0.9 UNIT/ML-% IJ SOLN
INTRAMUSCULAR | Status: AC
  Filled 2016-01-28: qty 500

## 2016-01-28 MED ORDER — POTASSIUM CHLORIDE CRYS ER 20 MEQ PO TBCR
40.0000 meq | EXTENDED_RELEASE_TABLET | Freq: Once | ORAL | Status: AC
  Administered 2016-01-28: 40 meq via ORAL
  Filled 2016-01-28: qty 2

## 2016-01-28 MED ORDER — SODIUM CHLORIDE 0.9 % IV SOLN
INTRAVENOUS | Status: DC
  Administered 2016-01-28: 07:00:00 via INTRAVENOUS

## 2016-01-28 MED ORDER — ONDANSETRON HCL 4 MG/2ML IJ SOLN: 4.0000 mg | Freq: Four times a day (QID) | INTRAMUSCULAR | Status: DC | PRN

## 2016-01-28 MED ORDER — SODIUM CHLORIDE 0.9 % WEIGHT BASED INFUSION: 1.0000 mL/kg/h | INTRAVENOUS | Status: DC

## 2016-01-28 SURGICAL SUPPLY — 11 items
CATH INFINITI 5 FR JL3.5 (CATHETERS) ×3 IMPLANT
CATH INFINITI 5FR AL1 (CATHETERS) ×3 IMPLANT
CATH INFINITI 5FR ANG PIGTAIL (CATHETERS) ×3 IMPLANT
CATH INFINITI JR4 5F (CATHETERS) ×3 IMPLANT
DEVICE RAD COMP TR BAND LRG (VASCULAR PRODUCTS) ×3 IMPLANT
GLIDESHEATH SLEND SS 6F .021 (SHEATH) ×3 IMPLANT
KIT HEART LEFT (KITS) ×4 IMPLANT
PACK CARDIAC CATHETERIZATION (CUSTOM PROCEDURE TRAY) ×4 IMPLANT
TRANSDUCER W/STOPCOCK (MISCELLANEOUS) ×4 IMPLANT
TUBING CIL FLEX 10 FLL-RA (TUBING) ×4 IMPLANT
WIRE SAFE-T 1.5MM-J .035X260CM (WIRE) ×3 IMPLANT

## 2016-01-28 NOTE — Discharge Instructions (Signed)
Radial Site Care °Refer to this sheet in the next few weeks. These instructions provide you with information about caring for yourself after your procedure. Your health care provider may also give you more specific instructions. Your treatment has been planned according to current medical practices, but problems sometimes occur. Call your health care provider if you have any problems or questions after your procedure. °WHAT TO EXPECT AFTER THE PROCEDURE °After your procedure, it is typical to have the following: °· Bruising at the radial site that usually fades within 1-2 weeks. °· Blood collecting in the tissue (hematoma) that may be painful to the touch. It should usually decrease in size and tenderness within 1-2 weeks. °HOME CARE INSTRUCTIONS °· Take medicines only as directed by your health care provider. °· You may shower 24-48 hours after the procedure or as directed by your health care provider. Remove the bandage (dressing) and gently wash the site with plain soap and water. Pat the area dry with a clean towel. Do not rub the site, because this may cause bleeding. °· Do not take baths, swim, or use a hot tub until your health care provider approves. °· Check your insertion site every day for redness, swelling, or drainage. °· Do not apply powder or lotion to the site. °· Do not flex or bend the affected arm for 24 hours or as directed by your health care provider. °· Do not push or pull heavy objects with the affected arm for 24 hours or as directed by your health care provider. °· Do not lift over 10 lb (4.5 kg) for 5 days after your procedure or as directed by your health care provider. °· Ask your health care provider when it is okay to: °¨ Return to work or school. °¨ Resume usual physical activities or sports. °¨ Resume sexual activity. °· Do not drive home if you are discharged the same day as the procedure. Have someone else drive you. °· You may drive 24 hours after the procedure unless otherwise  instructed by your health care provider. °· Do not operate machinery or power tools for 24 hours after the procedure. °· If your procedure was done as an outpatient procedure, which means that you went home the same day as your procedure, a responsible adult should be with you for the first 24 hours after you arrive home. °· Keep all follow-up visits as directed by your health care provider. This is important. °SEEK MEDICAL CARE IF: °· You have a fever. °· You have chills. °· You have increased bleeding from the radial site. Hold pressure on the site. °SEEK IMMEDIATE MEDICAL CARE IF: °· You have unusual pain at the radial site. °· You have redness, warmth, or swelling at the radial site. °· You have drainage (other than a small amount of blood on the dressing) from the radial site. °· The radial site is bleeding, and the bleeding does not stop after 30 minutes of holding steady pressure on the site. °· Your arm or hand becomes pale, cool, tingly, or numb. °  °This information is not intended to replace advice given to you by your health care provider. Make sure you discuss any questions you have with your health care provider. °  °Document Released: 08/19/2010 Document Revised: 08/07/2014 Document Reviewed: 02/02/2014 °Elsevier Interactive Patient Education ©2016 [employer] Inc[employer] ° °

## 2016-01-28 NOTE — Interval H&P Note (Signed)
History and Physical Interval Note:  01/28/2016 7:46 AM  [name]  has presented today for surgery, with the diagnosis of chf  The various methods of treatment have been discussed with the patient and family. After consideration of risks, benefits and other options for treatment, the patient has consented to  Procedure(s): Left Heart Cath and Coronary Angiography (N/A) as a surgical intervention .  The patient's history has been reviewed, patient examined, no change in status, stable for surgery.  I have reviewed the patient's chart and labs.  Questions were answered to the patient's satisfaction.     Burlie Cajamarca [employer]Lean

## 2016-01-28 NOTE — H&P (View-Only) (Signed)
Patient ID: [name] G [name], male   DOB: 08/26/1945, 70 y.o.   MRN: [id number] PCP: Dr. Jeannetta NapElkins  70 yo with history of CAD s/p anterolateral STEMI in 1/08 and ischemic cardiomyopathy presents for cardiology followup. He had a cardiac cath with patent LAD stent in 3/11.  Echo in 12/14 showed EF 50-55% with apical septal and apical hypokinesis.    In 5/17, patient was seen by [name] for chest pain evaluation.  He was noting chest tightness when carrying a heavy load or exerting himself moderately to heavily.  In fact, he ended up retiring due to the exertional chest pain.  He does not get chest pain with lighter activity like walking on flat ground.  He can walk 1-2 miles without problems.  He is short of breath walking up a flight of steps but does not get chest pain.  He has generalized fatigue that is new.  At times, he notes palpitations while lying in bed at night.   Echo was done, showing EF down to 35-40%. Cardiolite was done, showing EF 36% with large apical infarction.    Labs (11/15): LDL 87, HDL 36, HCT 45.5, K 3.4, creatinine 1.0, LFTs normal Labs (5/17): LDL 73, HDL 28, TSH normal, K 3.4, creatinine 1.06, HCT 44.6  PMH: 1. CAD: Anterolateral STEMI in 1/08 with BMS to LAD (had luminals in LCx and RCA). Myoview in 1/10 showed EF 45% with anteroseptal infarction and mild peri-infarction ischemia.  LHC (3/11) with patent LAD stent, EF 40-45%.  - ETT-Cardiolite (6/17) with EF 36%, 6 min exercise, hypertensive BP response, large apical infarct.   2. Ischemic cardiomyopathy: EF 30% at time of STEMI.  Most recent study was LV-gram in 3/11 with EF 40-45%.  Echo (12/14) showed EF 50-55%, apical septal hypokinesis an apical hypokinesis, no LV thrombus.  - Echo (6/17) with EF 35-40%, septal/apical akinesis, moderate LV dilation, mild MR.  3. HTN 4. Hyperlipidemia 5. ABIs in 10/14 were normal. 6. Abdominal US in 10/14 showed no AAA.  7. Aspirin allergy: Questionable.  Had hives once after taking  aspirin but has taken it since without problems.   8. Sleep study in 2011 with no OSA  SH: Married, has children, [place] in [place], retired, nonsmoker.   FH: CAD  ROS: All systems reviewed and negative except as per HPI.   Current Outpatient Prescriptions  Medication Sig Dispense Refill  . amLODipine (NORVASC) 5 MG tablet Take 1 tablet (5 mg total) by mouth daily. 90 tablet 3  . carvedilol (COREG) 25 MG tablet Take 12.5 mg by mouth 2 (two) times daily with a meal.     . clopidogrel (PLAVIX) 75 MG tablet Take 75 mg by mouth daily with breakfast.    . lisinopril-hydrochlorothiazide (PRINZIDE,ZESTORETIC) 20-25 MG tablet Take 1 tablet by mouth daily.    . potassium chloride SA (K-DUR,KLOR-CON) 20 MEQ tablet Take 3 tablets by mouth today 12/29/15 only then go back to 1 tablet by mouth daily after (Patient taking differently: Take 20 mEq by mouth daily. ) 60 tablet 11  . simvastatin (ZOCOR) 80 MG tablet Take 40 mg by mouth daily.     . sacubitril-valsartan (ENTRESTO) 24-26 MG Take 1 tablet by mouth 2 (two) times daily. 60 tablet 6   No current facility-administered medications for this encounter.   BP 150/90 mmHg  Pulse 64  Ht 6\' 1"  (1.854 m)  Wt 211 lb (95.709 kg)  BMI 27.84 kg/m2  SpO2 97% General: NAD Neck: No JVD, no  thyromegaly or thyroid nodule.  Lungs: Clear to auscultation bilaterally with normal respiratory effort. CV: Nondisplaced PMI.  Heart regular S1/S2, no S3/S4, no murmur.  No peripheral edema.  No carotid bruit.  Normal pedal pulses.  Abdomen: Soft, nontender, no hepatosplenomegaly, no distention.  Skin: Intact without lesions or rashes.  Neurologic: Alert and oriented x 3.  Psych: Normal affect. Extremities: No clubbing or cyanosis.   Assessment/Plan: 1. CAD: LAD stent was patent on cath in 3/11.  He has not been on aspirin because of a questionable aspirin allergy (had hives with aspirin once but has taken aspirin since without problems).  Over the last  several months, he has developed chest pain with moderate-heavy exertion as well as prominent fatigue and dyspnea.  EF is lower by echo and Cardiolite showed large area of infarction.  - He will continue Plavix, statin, Coreg, ACEI.  - I think that he needs coronary angiography.  We discussed this today.  After hearing risks/benefits, he agreed to the procedure.  Will arrange for later this week.  2. Hyperlipidemia: He has been on simvastatin 80 mg daily for well over a year without apparent side effects. Good LDL in 5/17.  3. Ischemic cardiomyopathy: EF 35-40%, this is lower than in the past.  He does not appear volume overloaded on exam. NYHA class II symptoms. - Continue Coreg 12.5 mg bid.  - Add Entresto 24/26 bid.  BMET in 2 wks.  4. HTN: BP is running high.  Continue Coreg and amlodipine, will add Entresto 24/26 bid.  Followup in 2 wks.   [name] 01/25/2016   

## 2016-02-14 ENCOUNTER — Ambulatory Visit (HOSPITAL_COMMUNITY)
Admission: RE | Admit: 2016-02-14 | Discharge: 2016-02-14 | Disposition: A | Discharge status: Home / Self Care | Payer: Medicare Other | Source: Ambulatory Visit | Attending: Cardiology | Admitting: Cardiology

## 2016-02-14 VITALS — BP 128/72 | HR 68 | Wt 207.6 lb

## 2016-02-14 DIAGNOSIS — E785 Hyperlipidemia, unspecified: Secondary | ICD-10-CM | POA: Insufficient documentation

## 2016-02-14 DIAGNOSIS — I252 Old myocardial infarction: Secondary | ICD-10-CM | POA: Insufficient documentation

## 2016-02-14 DIAGNOSIS — I5022 Chronic systolic (congestive) heart failure: Secondary | ICD-10-CM | POA: Diagnosis not present

## 2016-02-14 DIAGNOSIS — Z955 Presence of coronary angioplasty implant and graft: Secondary | ICD-10-CM | POA: Diagnosis not present

## 2016-02-14 DIAGNOSIS — I10 Essential (primary) hypertension: Secondary | ICD-10-CM | POA: Insufficient documentation

## 2016-02-14 DIAGNOSIS — I2510 Atherosclerotic heart disease of native coronary artery without angina pectoris: Secondary | ICD-10-CM

## 2016-02-14 DIAGNOSIS — I255 Ischemic cardiomyopathy: Secondary | ICD-10-CM | POA: Diagnosis not present

## 2016-02-14 MED ORDER — SACUBITRIL-VALSARTAN 24-26 MG PO TABS: 1.0000 | ORAL_TABLET | Freq: Two times a day (BID) | ORAL | Status: DC

## 2016-02-14 MED ORDER — SPIRONOLACTONE 25 MG PO TABS: 12.5000 mg | ORAL_TABLET | Freq: Every day | ORAL | Status: DC

## 2016-02-14 NOTE — Progress Notes (Signed)
Advanced Heart Failure Medication Review by a Pharmacist  Does the patient  feel that his/her medications are working for him/her?  yes  Has the patient been experiencing any side effects to the medications prescribed?  no  Does the patient measure his/her own blood pressure or blood glucose at home?  no   Does the patient have any problems obtaining medications due to transportation or finances?   no  Understanding of regimen: good Understanding of indications: good Potential of compliance: good Patient understands to avoid NSAIDs. Patient understands to avoid decongestants.  Issues to address at subsequent visits: None   Pharmacist comments:  Mr. [name] is a pleasant 70 yo M presenting without a medication list but with excellent recall of his regimen. He reports good compliance with his regimen and [facility]. I did notice that he is still taking lisinopril-hctz along with his new Entresto. We discussed that his lisinopril should be discontinued and we will fax in the Rx for Entresto to the [facility] instead of [facility]. He did not have any other medication-related questions or concerns for me at this time.   [name], PharmD, BCPS, CPP Clinical Pharmacist Pager: [phone] Phone: [phone] 02/14/2016 11:23 AM      Time with patient: 10 minutes Preparation and documentation time: 4 minutes Total time: 14 minutes

## 2016-02-14 NOTE — Progress Notes (Signed)
Prescriptions for Sherryll Burgerntresto and Sprio [facility] to [facility] at [phone]

## 2016-02-14 NOTE — Patient Instructions (Signed)
Stop Lisinopril   Start Spironolactone 12.5 mg (1/2 tab) daily  Labs in 1 week  We will contact you in 3 months to schedule your next appointment.

## 2016-02-15 ENCOUNTER — Encounter (HOSPITAL_COMMUNITY): Payer: Self-pay | Admitting: Pharmacist

## 2016-02-15 NOTE — Progress Notes (Signed)
Patient ID: [name], male   DOB: 1946-06-19, 70 y.o.   MRN: [id number] PCP: Dr. Jeannetta Miller  70 yo with history of CAD s/p anterolateral STEMI in 1/08 and ischemic cardiomyopathy presents for cardiology followup. He had a cardiac cath with patent LAD stent in 3/11.  Echo in 12/14 showed EF 50-55% with apical septal and apical hypokinesis.    In 5/17, patient was seen by [name] for chest pain evaluation.  He was noting chest tightness when carrying a heavy load or exerting himself moderately to heavily.  In fact, he ended up retiring due to the exertional chest pain.  He had generalized fatigue that was new.    Echo was done, showing EF down to 35-40%. Cardiolite was done, showing EF 36% with large apical infarction.  LHC was done, showing patent LAD stent and 70% stenosis in a small branch off OM1, medical management.   Since the cath, he has been feeling better.  Energy level still not great.  He is now walking 30-40 minutes/day.  This has helped with his stamina.  No dyspnea walking on flat ground.  Mild dyspnea walking up hills.  No chest pain now.   Labs (11/15): LDL 87, HDL 36, HCT 45.5, K 3.4, creatinine 1.0, LFTs normal Labs (5/17): LDL 73, HDL 28, TSH normal, K 3.4, creatinine 1.06, HCT 44.6 Labs (6/17): BNP 81, K 3.2, creatinine 1.18  PMH: 1. CAD: Anterolateral STEMI in 1/08 with BMS to LAD (had luminals in LCx and RCA). Myoview in 1/10 showed EF 45% with anteroseptal infarction and mild peri-infarction ischemia.  LHC (3/11) with patent LAD stent, EF 40-45%.  - ETT-Cardiolite (6/17) with EF 36%, 6 min exercise, hypertensive BP response, large apical infarct.   - LHC (6/17): patent LAD stent, 70% stenosis small branch off OM1.  2. Ischemic cardiomyopathy: EF 30% at time of STEMI.  Most recent study was LV-gram in 3/11 with EF 40-45%.  Echo (12/14) showed EF 50-55%, apical septal hypokinesis an apical hypokinesis, no LV thrombus.  - Echo (6/17) with EF 35-40%, septal/apical akinesis,  moderate LV dilation, mild MR.  3. HTN 4. Hyperlipidemia 5. ABIs in 10/14 were normal. 6. Abdominal US in 10/14 showed no AAA.  7. Aspirin allergy: Questionable.  Had hives once after taking aspirin but has taken it since without problems.   8. Sleep study in 2011 with no OSA  SH: Married, has children, lives in [place], retired, nonsmoker.   FH: CAD  ROS: All systems reviewed and negative except as per HPI.   Current Outpatient Prescriptions  Medication Sig Dispense Refill  . amLODipine (NORVASC) 5 MG tablet Take 1 tablet (5 mg total) by mouth daily. 90 tablet 3  . carvedilol (COREG) 25 MG tablet Take 12.5 mg by mouth 2 (two) times daily with a meal.     . clopidogrel (PLAVIX) 75 MG tablet Take 75 mg by mouth daily with breakfast.    . potassium chloride SA (K-DUR,KLOR-CON) 20 MEQ tablet Take 1 tablet (20 mEq total) by mouth 2 (two) times daily. 60 tablet 3  . sacubitril-valsartan (ENTRESTO) 24-26 MG Take 1 tablet by mouth 2 (two) times daily. 180 tablet 3  . simvastatin (ZOCOR) 40 MG tablet Take 40 mg by mouth daily.    Marland Kitchen spironolactone (ALDACTONE) 25 MG tablet Take 0.5 tablets (12.5 mg total) by mouth daily. 15 tablet 3   No current facility-administered medications for this encounter.   BP 128/72 mmHg  Pulse 68  Wt 207  lb 9 oz (94.15 kg)  SpO2 98% General: NAD Neck: No JVD, no thyromegaly or thyroid nodule.  Lungs: Clear to auscultation bilaterally with normal respiratory effort. CV: Nondisplaced PMI.  Heart regular S1/S2, no S3/S4, no murmur.  No peripheral edema.  No carotid bruit.  Normal pedal pulses.  Abdomen: Soft, nontender, no hepatosplenomegaly, no distention.  Skin: Intact without lesions or rashes.  Neurologic: Alert and oriented x 3.  Psych: Normal affect. Extremities: No clubbing or cyanosis.   Assessment/Plan: 1. CAD: LAD stent was patent on cath in 3/11.  He has not been on aspirin because of a questionable aspirin allergy (had hives with aspirin  once but has taken aspirin since without problems).  Recent cath showed patent LAD stent and 70% stenosis in branch off OM1.  Medical management.  - He will continue Plavix, statin, Coreg, ACEI.  2. Hyperlipidemia: He has been on simvastatin 80 mg daily for well over a year without apparent side effects. Good LDL in 5/17.  3. Ischemic cardiomyopathy: EF 35-40%, this is lower than in the past.  He does not appear volume overloaded on exam. NYHA class II symptoms. - Continue Coreg 12.5 mg bid.  - Continue Entresto 24/26 bid.  He is still taking lisinopril/HCTZ, which he needs to stop now.   - Add spironolactone 12.5 mg daily. BMET in 1 week, may be able to stop KCl.  4. HTN: BP controlled.  Followup in 3 months.    [name] 02/15/2016

## 2016-02-16 ENCOUNTER — Ambulatory Visit: Payer: Medicare Other | Admitting: Cardiology

## 2016-02-17 ENCOUNTER — Telehealth (HOSPITAL_COMMUNITY): Payer: Self-pay

## 2016-02-17 NOTE — Telephone Encounter (Signed)
Patient calling CHF clinic triage line to see when he needs his labs repeated. Per Dr. Alford HighlandMcLean's OV note from 02/14/16 visit, advised patient to come in this coming Monday (02/21/16) for 1 week bmet. Aware and agreeable.  [name], [name]

## 2016-02-21 ENCOUNTER — Ambulatory Visit (HOSPITAL_COMMUNITY)
Admission: RE | Admit: 2016-02-21 | Discharge: 2016-02-21 | Disposition: A | Discharge status: Home / Self Care | Payer: Medicare Other | Source: Ambulatory Visit | Attending: Internal Medicine | Admitting: Internal Medicine

## 2016-02-21 DIAGNOSIS — I5022 Chronic systolic (congestive) heart failure: Secondary | ICD-10-CM | POA: Insufficient documentation

## 2016-02-21 DIAGNOSIS — I5023 Acute on chronic systolic (congestive) heart failure: Secondary | ICD-10-CM | POA: Diagnosis not present

## 2016-02-21 LAB — BASIC METABOLIC PANEL
ANION GAP: 5 (ref 5–15)
BUN: 14 mg/dL (ref 6–20)
CHLORIDE: 106 mmol/L (ref 101–111)
CO2: 27 mmol/L (ref 22–32)
Calcium: 8.9 mg/dL (ref 8.9–10.3)
Creatinine, Ser: 0.95 mg/dL (ref 0.61–1.24)
GFR calc non Af Amer: >60 mL/min (ref >60)
Glucose, Bld: 131 mg/dL — ABNORMAL HIGH (ref 65–99)
POTASSIUM: 4 mmol/L (ref 3.5–5.1)
Sodium: 138 mmol/L (ref 135–145)

## 2016-03-31 ENCOUNTER — Ambulatory Visit: Payer: No Typology Code available for payment source | Admitting: Cardiology

## 2016-05-30 ENCOUNTER — Ambulatory Visit (HOSPITAL_COMMUNITY)
Admission: RE | Admit: 2016-05-30 | Discharge: 2016-05-30 | Disposition: A | Discharge status: Home / Self Care | Payer: Medicare Other | Source: Ambulatory Visit | Attending: Cardiology | Admitting: Cardiology

## 2016-05-30 VITALS — BP 122/78 | HR 64 | Wt 209.5 lb

## 2016-05-30 DIAGNOSIS — Z8249 Family history of ischemic heart disease and other diseases of the circulatory system: Secondary | ICD-10-CM | POA: Diagnosis not present

## 2016-05-30 DIAGNOSIS — E785 Hyperlipidemia, unspecified: Secondary | ICD-10-CM | POA: Insufficient documentation

## 2016-05-30 DIAGNOSIS — Z955 Presence of coronary angioplasty implant and graft: Secondary | ICD-10-CM | POA: Diagnosis not present

## 2016-05-30 DIAGNOSIS — Z7901 Long term (current) use of anticoagulants: Secondary | ICD-10-CM | POA: Insufficient documentation

## 2016-05-30 DIAGNOSIS — I252 Old myocardial infarction: Secondary | ICD-10-CM | POA: Insufficient documentation

## 2016-05-30 DIAGNOSIS — I110 Hypertensive heart disease with heart failure: Secondary | ICD-10-CM | POA: Diagnosis not present

## 2016-05-30 DIAGNOSIS — I5022 Chronic systolic (congestive) heart failure: Secondary | ICD-10-CM | POA: Diagnosis not present

## 2016-05-30 DIAGNOSIS — I255 Ischemic cardiomyopathy: Secondary | ICD-10-CM | POA: Insufficient documentation

## 2016-05-30 DIAGNOSIS — I2510 Atherosclerotic heart disease of native coronary artery without angina pectoris: Secondary | ICD-10-CM | POA: Insufficient documentation

## 2016-05-30 LAB — BASIC METABOLIC PANEL
Anion gap: 6 (ref 5–15)
BUN: 14 mg/dL (ref 6–20)
CHLORIDE: 105 mmol/L (ref 101–111)
CO2: 28 mmol/L (ref 22–32)
CREATININE: 1.09 mg/dL (ref 0.61–1.24)
Calcium: 9.4 mg/dL (ref 8.9–10.3)
GFR calc non Af Amer: >60 mL/min (ref >60)
Glucose, Bld: 138 mg/dL — ABNORMAL HIGH (ref 65–99)
POTASSIUM: 4.1 mmol/L (ref 3.5–5.1)
Sodium: 139 mmol/L (ref 135–145)

## 2016-05-30 MED ORDER — SACUBITRIL-VALSARTAN 49-51 MG PO TABS: 1.0000 | ORAL_TABLET | Freq: Two times a day (BID) | ORAL | 6 refills | Status: DC

## 2016-05-30 NOTE — Progress Notes (Signed)
Patient ID: [name], male   DOB: 07/02/1946, 70 y.o.   MRN: [id number] PCP: Dr. Jeannetta NapElkins Cardiology: Dr. Shirlee LatchMcLean  70 yo with history of CAD s/p anterolateral STEMI in 1/08 and ischemic cardiomyopathy presents for cardiology followup. He had a cardiac cath with patent LAD stent in 3/11.  Echo in 12/14 showed EF 50-55% with apical septal and apical hypokinesis.    In 5/17, patient was seen by [name] for chest pain evaluation.  He was noting chest tightness when carrying a heavy load or exerting himself moderately to heavily.  In fact, he ended up retiring due to the exertional chest pain.  He had generalized fatigue that was new.    Echo was done, showing EF down to 35-40%. Cardiolite was done, showing EF 36% with large apical infarction.  LHC was done, showing patent LAD stent and 70% stenosis in a small branch off OM1, medical management.   Patient has been doing well.  No exertional dyspnea.  No chest pain.  No orthopnea/PND.  Taking all meds with no problems.    Labs (11/15): LDL 87, HDL 36, HCT 45.5, K 3.4, creatinine 1.0, LFTs normal Labs (5/17): LDL 73, HDL 28, TSH normal, K 3.4, creatinine 1.06, HCT 44.6 Labs (6/17): BNP 81, K 3.2, creatinine 1.18 Labs (7/17): K 4, creatinine 0.95  PMH: 1. CAD: Anterolateral STEMI in 1/08 with BMS to LAD (had luminals in LCx and RCA). Myoview in 1/10 showed EF 45% with anteroseptal infarction and mild peri-infarction ischemia.  LHC (3/11) with patent LAD stent, EF 40-45%.  - ETT-Cardiolite (6/17) with EF 36%, 6 min exercise, hypertensive BP response, large apical infarct.   - LHC (6/17): patent LAD stent, 70% stenosis small branch off OM1.  2. Ischemic cardiomyopathy: EF 30% at time of STEMI.  Most recent study was LV-gram in 3/11 with EF 40-45%.  Echo (12/14) showed EF 50-55%, apical septal hypokinesis an apical hypokinesis, no LV thrombus.  - Echo (6/17) with EF 35-40%, septal/apical akinesis, moderate LV dilation, mild MR.  3. HTN 4.  Hyperlipidemia 5. ABIs in 10/14 were normal. 6. Abdominal US in 10/14 showed no AAA.  7. Aspirin allergy: Questionable.  Had hives once after taking aspirin but has taken it since without problems.   8. Sleep study in 2011 with no OSA  SH: Married, has children, lives in [place], retired, nonsmoker.   FH: CAD  ROS: All systems reviewed and negative except as per HPI.   Current Outpatient Prescriptions  Medication Sig Dispense Refill  . carvedilol (COREG) 25 MG tablet Take 12.5 mg by mouth 2 (two) times daily with a meal.     . clopidogrel (PLAVIX) 75 MG tablet Take 75 mg by mouth daily with breakfast.    . potassium chloride SA (K-DUR,KLOR-CON) 20 MEQ tablet Take 1 tablet (20 mEq total) by mouth 2 (two) times daily. 60 tablet 3  . simvastatin (ZOCOR) 40 MG tablet Take 40 mg by mouth daily.    Marland Kitchen. spironolactone (ALDACTONE) 25 MG tablet Take 0.5 tablets (12.5 mg total) by mouth daily. 15 tablet 3  . sacubitril-valsartan (ENTRESTO) 49-51 MG Take 1 tablet by mouth 2 (two) times daily. 60 tablet 6   No current facility-administered medications for this encounter.    BP 122/78   Pulse 64   Wt 209 lb 8 oz (95 kg)   SpO2 97%   BMI 28.41 kg/m  General: NAD Neck: No JVD, no thyromegaly or thyroid nodule.  Lungs: Clear to auscultation  bilaterally with normal respiratory effort. CV: Nondisplaced PMI.  Heart regular S1/S2, no S3/S4, no murmur.  No peripheral edema.  No carotid bruit.  Normal pedal pulses.  Abdomen: Soft, nontender, no hepatosplenomegaly, no distention.  Skin: Intact without lesions or rashes.  Neurologic: Alert and oriented x 3.  Psych: Normal affect. Extremities: No clubbing or cyanosis.   Assessment/Plan: 1. CAD: LAD stent was patent on cath in 3/11.  He has not been on aspirin because of a questionable aspirin allergy (had hives with aspirin once but has taken aspirin since without problems).  Recent cath showed patent LAD stent and 70% stenosis in branch off  OM1.  Medical management.  - He will continue Plavix, statin, Coreg.  2. Hyperlipidemia: Good LDL in 5/17.  Continue simvastatin. 3. Ischemic cardiomyopathy: EF 35-40% on last echo.  He does not appear volume overloaded on exam. NYHA class II symptoms. - Continue Coreg 12.5 mg bid.  - Increase Entresto to 49/51 bid and stop amlodipine.  - Continue spironolactone 12.5 daily.  - BMET today (may be able to stop KCl), and repeat BMET in 2 wks.  4. HTN: BP controlled.  Followup in 6 months.    Marca AnconaDalton Ann Groeneveld 05/30/2016

## 2016-05-30 NOTE — Progress Notes (Signed)
Medication Samples have been provided to the patient.  Drug name: Sherryll Burgerntresto       Strength: 49/51        Qty: 2  LOT: [id number]  Exp.Date: 7/19  Dosing instructions: 1 tab Twice daily   The patient has been instructed regarding the correct time, dose, and frequency of taking this medication, including desired effects and most common side effects.   Koa Zoeller 10:24 AM 05/30/2016

## 2016-05-30 NOTE — Patient Instructions (Signed)
Stop Amlodipine  Increase Entresto to 49/51 mg Twice daily   Labs today  Labs in 2 weeks  We will contact you in 6 months to schedule your next appointment.

## 2016-06-06 ENCOUNTER — Other Ambulatory Visit (HOSPITAL_COMMUNITY): Payer: Medicare Other

## 2016-06-13 ENCOUNTER — Ambulatory Visit (HOSPITAL_COMMUNITY)
Admission: RE | Admit: 2016-06-13 | Discharge: 2016-06-13 | Disposition: A | Discharge status: Home / Self Care | Payer: Medicare Other | Source: Ambulatory Visit | Attending: Cardiology | Admitting: Cardiology

## 2016-06-13 DIAGNOSIS — I5022 Chronic systolic (congestive) heart failure: Secondary | ICD-10-CM | POA: Insufficient documentation

## 2016-06-13 LAB — BASIC METABOLIC PANEL
ANION GAP: 6 (ref 5–15)
BUN: 9 mg/dL (ref 6–20)
CHLORIDE: 103 mmol/L (ref 101–111)
CO2: 27 mmol/L (ref 22–32)
Calcium: 9 mg/dL (ref 8.9–10.3)
Creatinine, Ser: 1.13 mg/dL (ref 0.61–1.24)
GFR calc Af Amer: >60 mL/min (ref >60)
Glucose, Bld: 131 mg/dL — ABNORMAL HIGH (ref 65–99)
POTASSIUM: 3.9 mmol/L (ref 3.5–5.1)
SODIUM: 136 mmol/L (ref 135–145)

## 2017-03-08 DIAGNOSIS — I10 Essential (primary) hypertension: Secondary | ICD-10-CM | POA: Diagnosis not present

## 2017-03-08 DIAGNOSIS — R42 Dizziness and giddiness: Secondary | ICD-10-CM | POA: Diagnosis not present

## 2017-08-23 ENCOUNTER — Ambulatory Visit (HOSPITAL_COMMUNITY)
Admission: RE | Admit: 2017-08-23 | Discharge: 2017-08-23 | Disposition: A | Discharge status: Home / Self Care | Payer: Medicare Other | Source: Ambulatory Visit | Attending: Internal Medicine | Admitting: Internal Medicine

## 2017-08-23 VITALS — BP 148/86 | HR 72 | Wt 217.0 lb

## 2017-08-23 DIAGNOSIS — I10 Essential (primary) hypertension: Secondary | ICD-10-CM | POA: Diagnosis not present

## 2017-08-23 DIAGNOSIS — Z79899 Other long term (current) drug therapy: Secondary | ICD-10-CM | POA: Insufficient documentation

## 2017-08-23 DIAGNOSIS — E785 Hyperlipidemia, unspecified: Secondary | ICD-10-CM | POA: Diagnosis not present

## 2017-08-23 DIAGNOSIS — I2510 Atherosclerotic heart disease of native coronary artery without angina pectoris: Secondary | ICD-10-CM | POA: Insufficient documentation

## 2017-08-23 DIAGNOSIS — I255 Ischemic cardiomyopathy: Secondary | ICD-10-CM | POA: Insufficient documentation

## 2017-08-23 DIAGNOSIS — Z7902 Long term (current) use of antithrombotics/antiplatelets: Secondary | ICD-10-CM | POA: Insufficient documentation

## 2017-08-23 DIAGNOSIS — I5022 Chronic systolic (congestive) heart failure: Secondary | ICD-10-CM | POA: Diagnosis not present

## 2017-08-23 LAB — BASIC METABOLIC PANEL
Anion gap: 11 (ref 5–15)
BUN: 15 mg/dL (ref 6–20)
CHLORIDE: 105 mmol/L (ref 101–111)
CO2: 21 mmol/L — ABNORMAL LOW (ref 22–32)
CREATININE: 0.99 mg/dL (ref 0.61–1.24)
Calcium: 8.8 mg/dL — ABNORMAL LOW (ref 8.9–10.3)
GFR calc Af Amer: >60 mL/min (ref >60)
GFR calc non Af Amer: >60 mL/min (ref >60)
GLUCOSE: 140 mg/dL — AB (ref 65–99)
Potassium: 3.9 mmol/L (ref 3.5–5.1)
SODIUM: 137 mmol/L (ref 135–145)

## 2017-08-23 LAB — LIPID PANEL
CHOLESTEROL: 112 mg/dL (ref 0–200)
HDL: 25 mg/dL — ABNORMAL LOW (ref >40)
LDL CALC: 54 mg/dL (ref 0–99)
Total CHOL/HDL Ratio: 4.5 RATIO
Triglycerides: 167 mg/dL — ABNORMAL HIGH (ref <150)
VLDL: 33 mg/dL (ref 0–40)

## 2017-08-23 MED ORDER — SACUBITRIL-VALSARTAN 97-103 MG PO TABS: 1.0000 | ORAL_TABLET | Freq: Two times a day (BID) | ORAL | 3 refills | Status: DC

## 2017-08-23 MED ORDER — SACUBITRIL-VALSARTAN 97-103 MG PO TABS: 1.0000 | ORAL_TABLET | Freq: Two times a day (BID) | ORAL | 3 refills | Status: AC

## 2017-08-23 NOTE — Patient Instructions (Signed)
Increase Entresto 97/103 (1 tab), twice a day  Labs drawn today (if we do not call you, then your lab work was stable)   Your physician recommends that you return for lab work in: 10 days  Your physician has requested that you have an echocardiogram. Echocardiography is a painless test that uses sound waves to create images of your heart. It provides your doctor with information about the size and shape of your heart and how well your heart's chambers and valves are working. This procedure takes approximately one hour. There are no restrictions for this procedure.  Your physician recommends that you schedule a follow-up appointment in: 6 months with Dr. Shirlee LatchMcLean   an a echocardiogram

## 2017-08-23 NOTE — Progress Notes (Signed)
Patient ID: [name], male   DOB: [birth year], 72 y.o.   MRN: [id number]     Advanced Heart Failure Clinic Note   PCP: Dr. Jeannetta Nap Cardiology: Dr. Vickey Huger is a 72 y.o. male with history of CAD s/p anterolateral STEMI in 1/08 and ischemic cardiomyopathy presents for cardiology followup. He had a cardiac cath with patent LAD stent in 3/11.  Echo in 12/14 showed EF 50-55% with apical septal and apical hypokinesis.    In 5/17, patient was seen by [name] for chest pain evaluation.  He was noting chest tightness when carrying a heavy load or exerting himself moderately to heavily.  In fact, he ended up retiring due to the exertional chest pain.  He had generalized fatigue that was new.    Echo 12/2015 showed EF down to 35-40%. Cardiolite 12/30/2015 with EF 36% with large apical infarction.  LHC 6/30/117 showing patent LAD stent and 70% stenosis in a small branch off OM1, medical management.   Pt presents today for follow up. Last seen 04/2016. Doing well overall. Taking all of his medications as directed. He has mild DOE climbing hills, but none on flat ground or with ADLs. He denies edema, orthopnea, PND, or CP. He has an episode of Vertigo in August, but has had no recurrence. BP runs in 130-140s at home. Denies lightheadedness.  Labs (11/15): LDL 87, HDL 36, HCT 45.5, K 3.4, creatinine 1.0, LFTs normal Labs (5/17): LDL 73, HDL 28, TSH normal, K 3.4, creatinine 1.06, HCT 44.6 Labs (6/17): BNP 81, K 3.2, creatinine 1.18 Labs (7/17): K 4, creatinine 0.95  PMH: 1. CAD: Anterolateral STEMI in 1/08 with BMS to LAD (had luminals in LCx and RCA). Myoview in 1/10 showed EF 45% with anteroseptal infarction and mild peri-infarction ischemia.  LHC (3/11) with patent LAD stent, EF 40-45%.  - ETT-Cardiolite (6/17) with EF 36%, 6 min exercise, hypertensive BP response, large apical infarct.   - LHC (6/17): patent LAD stent, 70% stenosis small branch off OM1.  2. Ischemic cardiomyopathy: EF  30% at time of STEMI.  Most recent study was LV-gram in 3/11 with EF 40-45%.  Echo (12/14) showed EF 50-55%, apical septal hypokinesis an apical hypokinesis, no LV thrombus.  - Echo (6/17) with EF 35-40%, septal/apical akinesis, moderate LV dilation, mild MR.  3. HTN 4. Hyperlipidemia 5. ABIs in 10/14 were normal. 6. Abdominal US in 10/14 showed no AAA.  7. Aspirin allergy: Questionable.  Had hives once after taking aspirin but has taken it since without problems.   8. Sleep study in 2011 with no OSA  SH: Married, has children, lives in [place], retired, nonsmoker.   FH: CAD  Review of systems complete and found to be negative unless listed in HPI.    Current Outpatient Medications  Medication Sig Dispense Refill  . carvedilol (COREG) 25 MG tablet Take 12.5 mg by mouth 2 (two) times daily with a meal.     . clopidogrel (PLAVIX) 75 MG tablet Take 75 mg by mouth daily with breakfast.    . potassium chloride SA (K-DUR,KLOR-CON) 20 MEQ tablet Take 1 tablet (20 mEq total) by mouth 2 (two) times daily. 60 tablet 3  . sacubitril-valsartan (ENTRESTO) 49-51 MG Take 1 tablet by mouth 2 (two) times daily. 60 tablet 6  . simvastatin (ZOCOR) 40 MG tablet Take 40 mg by mouth daily.    Marland Kitchen spironolactone (ALDACTONE) 25 MG tablet Take 0.5 tablets (12.5 mg total) by mouth daily. 15  tablet 3   No current facility-administered medications for this encounter.    Vitals:   08/23/17 0919  BP: (!) 148/86  Pulse: 72  SpO2: 94%  Weight: 217 lb (98.4 kg)   Wt Readings from Last 3 Encounters:  08/23/17 217 lb (98.4 kg)  05/30/16 209 lb 8 oz (95 kg)  02/14/16 207 lb 9 oz (94.1 kg)   BP Readings from Last 3 Encounters:  08/23/17 (!) 148/86  05/30/16 122/78  02/14/16 128/72   Physical Exam General: Well appearing. No resp difficulty. HEENT: Normal Neck: Supple. JVP 5-6. Carotids 2+ bilat; no bruits. No thyromegaly or nodule noted. Cor: PMI nondisplaced. RRR, No M/G/R noted Lungs: CTAB,  normal effort. Abdomen: Soft, non-tender, non-distended, no HSM. No bruits or masses. +BS  Extremities: No cyanosis, clubbing, or rash. R and LLE no edema.  Neuro: Alert & orientedx3, cranial nerves grossly intact. moves all 4 extremities w/o difficulty. Affect pleasant   Assessment/Plan: 1. CAD: LAD stent was patent on cath in 3/11.  He has not been on aspirin because of a questionable aspirin allergy (had hives with aspirin once but has taken aspirin since without problems).  Recent cath showed patent LAD stent and 70% stenosis in branch off OM1.  Medical management.  - He will continue Plavix, statin, Coreg.  2. Hyperlipidemia:  - Good LDL in 5/17.  Continue statin.  - Lipids today.  3. Ischemic cardiomyopathy: EF 35-40% on last echo.  He does not appear volume overloaded on exam. - NYHA II symptoms - Volume stable on exam.   - Continue Coreg 12.5 mg BID - Increase Entresto to 97/103 mg BID. BMET today, and 7-10 days after increasing.  - Continue spironolactone 12.5 daily.  - BMET today (may be able to stop KCl), and repeat BMET in 2 wks.  4. HTN:  - Meds as above  RTC 6 months with Echo. Meds and labs as above.   Graciella FreerMichael Andrew Jahden Schara, PA-C  08/23/2017   Greater than 50% of the 25 minute visit was spent in counseling/coordination of care regarding disease state education, salt/fluid restriction, sliding scale diuretics, and medication compliance.

## 2017-08-28 DIAGNOSIS — J209 Acute bronchitis, unspecified: Secondary | ICD-10-CM | POA: Diagnosis not present

## 2017-09-04 ENCOUNTER — Ambulatory Visit (HOSPITAL_COMMUNITY)
Admission: RE | Admit: 2017-09-04 | Discharge: 2017-09-04 | Disposition: A | Discharge status: Home / Self Care | Payer: Medicare Other | Source: Ambulatory Visit | Attending: Cardiology | Admitting: Cardiology

## 2017-09-04 DIAGNOSIS — I5022 Chronic systolic (congestive) heart failure: Secondary | ICD-10-CM | POA: Diagnosis not present

## 2017-09-04 LAB — BASIC METABOLIC PANEL
Anion gap: 9 (ref 5–15)
BUN: 16 mg/dL (ref 6–20)
CALCIUM: 9 mg/dL (ref 8.9–10.3)
CO2: 26 mmol/L (ref 22–32)
CREATININE: 1.06 mg/dL (ref 0.61–1.24)
Chloride: 103 mmol/L (ref 101–111)
GFR calc Af Amer: >60 mL/min (ref >60)
GLUCOSE: 136 mg/dL — AB (ref 65–99)
POTASSIUM: 4.5 mmol/L (ref 3.5–5.1)
SODIUM: 138 mmol/L (ref 135–145)

## 2017-11-25 IMAGING — NM NM MISC PROCEDURE
6 series · 36 of 36 positions shown · non-contrast
Comparison: none

[Series 1: wbr_s-proj_st stress-sum-em · 6.40mm/px · 6 of 64 frames shown]
[frame 6/64]
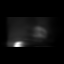
[frame 16/64]
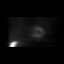
[frame 27/64]
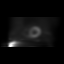
[frame 38/64]
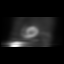
[frame 48/64]
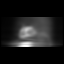
[frame 59/64]
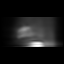

[Series 1: stress-gsp · 6.40mm/px · 6 of 512 frames shown]
[frame 43/512]
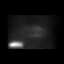
[frame 128/512]
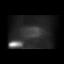
[frame 214/512]
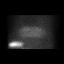
[frame 299/512]
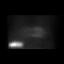
[frame 384/512]
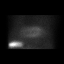
[frame 470/512]
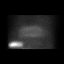

[Series 1: wbr_s-proj_st stress-gsp · 6.40mm/px · 6 of 512 frames shown]
[frame 43/512]
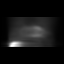
[frame 128/512]
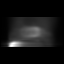
[frame 214/512]
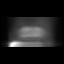
[frame 299/512]
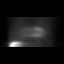
[frame 384/512]
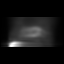
[frame 470/512]
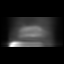

[Series 1: rest · 6.40mm/px · 6 of 64 frames shown]
[frame 6/64]
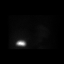
[frame 16/64]
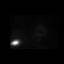
[frame 27/64]
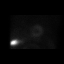
[frame 38/64]
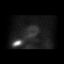
[frame 48/64]
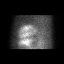
[frame 59/64]
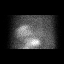

[Series 1: stress-sum-em · 6.40mm/px · 6 of 64 frames shown]
[frame 6/64]
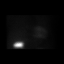
[frame 16/64]
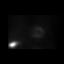
[frame 27/64]
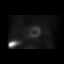
[frame 38/64]
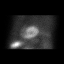
[frame 48/64]
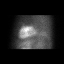
[frame 59/64]
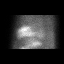

[Series 1: wbr_r-proj_st rest · 6.40mm/px · 6 of 64 frames shown]
[frame 6/64]
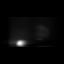
[frame 16/64]
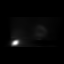
[frame 27/64]
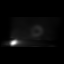
[frame 38/64]
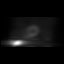
[frame 48/64]
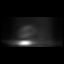
[frame 59/64]
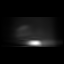

[36 of 36 positions shown; findings below may reference images not displayed]

Canned report from images found in remote index.

Refer to host system for actual result text.

## 2018-09-03 NOTE — Progress Notes (Signed)
Patient ID: [name], male   DOB: 04/14/1946, 73 y.o.   MRN: [id number]     Advanced Heart Failure Clinic Note  [facility]  PCP: [name]. Jeannetta Miller Cardiology: [name]. Vickey Miller  [name] is a 73 y.o. male with history of CAD s/p anterolateral STEMI in 1/08 and ischemic cardiomyopathy presents for cardiology followup. He had a cardiac cath with patent LAD stent in 3/11.  Echo in 12/14 showed EF 50-55% with apical septal and apical hypokinesis.    In 5/17, patient was seen by [name] CarsonMichelle Miller for chest pain evaluation.  He was noting chest tightness when carrying a heavy load or exerting himself moderately to heavily.  In fact, he ended up retiring due to the exertional chest pain.  He had generalized fatigue that was new.    Echo 12/2015 showed EF down to 35-40%. Cardiolite 12/30/2015 with EF 36% with large apical infarction.  LHC 6/30/117 showing patent LAD stent and 70% stenosis in a small branch off OM1, medical management.   Today he returns for HF follow up. He has not been seen in a year. At the last visit entresto was increased to 97-103 twice a day. Overall feeling fine.Over the last year he has taken sublingual NTG 1-2 times. Brief episode. Denies SOB/PND/Orthopnea. Does admit to SOB with steps. Appetite ok. No fever or chills. Weight at home has been stable. Walks on the treadmill a few days a week. Taking all medications but stopped spironolactone due to breast tenderness. Lives alone.  [facility].    Labs (11/15): LDL 87, HDL 36, HCT 45.5, K 3.4, creatinine 1.0, LFTs normal Labs (5/17): LDL 73, HDL 28, TSH normal, K 3.4, creatinine 1.06, HCT 44.6 Labs (6/17): BNP 81, K 3.2, creatinine 1.18 Labs (7/17): K 4, creatinine 0.95 Lasbs 08/23/2017: Cholesterol 112 HDL 25 LDL 54 TG 167  Labs 09/04/2017: K 4.5 Creatinine 1.06   PMH: 1. CAD: Anterolateral STEMI in 1/08 with BMS to LAD (had luminals in LCx and RCA). Myoview in 1/10 showed EF 45% with anteroseptal infarction and mild  peri-infarction ischemia.  LHC (3/11) with patent LAD stent, EF 40-45%.  - ETT-Cardiolite (6/17) with EF 36%, 6 min exercise, hypertensive BP response, large apical infarct.   - LHC (6/17): patent LAD stent, 70% stenosis small branch off OM1.  2. Ischemic cardiomyopathy: EF 30% at time of STEMI.  Most recent study was LV-gram in 3/11 with EF 40-45%.  Echo (12/14) showed EF 50-55%, apical septal hypokinesis an apical hypokinesis, no LV thrombus.  - Echo (6/17) with EF 35-40%, septal/apical akinesis, moderate LV dilation, mild MR.  3. HTN 4. Hyperlipidemia 5. ABIs in 10/14 were normal. 6. Abdominal US in 10/14 showed no AAA.  7. Aspirin allergy: Questionable.  Had hives once after taking aspirin but has taken it since without problems.   8. Sleep study in 2011 with no OSA  SH: Married, has children, lives in [name], retired, nonsmoker.   FH: CAD  Review of systems complete and found to be negative unless listed in HPI.    Current Outpatient Medications  Medication Sig Dispense Refill  . carvedilol (COREG) 25 MG tablet Take 12.5 mg by mouth 2 (two) times daily with a meal.     . clopidogrel (PLAVIX) 75 MG tablet Take 75 mg by mouth daily with breakfast.    . sacubitril-valsartan (ENTRESTO) 97-103 MG Take 1 tablet by mouth 2 (two) times daily. 60 tablet 3  . simvastatin (ZOCOR) 40 MG tablet  Take 40 mg by mouth daily.    Marland Kitchen eplerenone (INSPRA) 25 MG tablet Take 0.5 tablets (12.5 mg total) by mouth daily. 15 tablet 11   No current facility-administered medications for this encounter.    Vitals:   09/04/18 0936  BP: (!) 180/108  Pulse: 71  SpO2: 97%  Weight: 99.9 kg (220 lb 3.2 oz)   Wt Readings from Last 3 Encounters:  09/04/18 99.9 kg (220 lb 3.2 oz)  08/23/17 98.4 kg (217 lb)  05/30/16 95 kg (209 lb 8 oz)   BP Readings from Last 3 Encounters:  09/04/18 (!) 180/108  08/23/17 (!) 148/86  05/30/16 122/78   Physical Exam General:  Well appearing. No resp  difficulty HEENT: normal Neck: supple. no JVD. Carotids 2+ bilat; no bruits. No lymphadenopathy or thryomegaly appreciated. Cor: PMI nondisplaced. Regular rate & rhythm. No rubs, gallops or murmurs. Lungs: clear Abdomen: soft, nontender, nondistended. No hepatosplenomegaly. No bruits or masses. Good bowel sounds. Extremities: no cyanosis, clubbing, rash, edema Neuro: alert & orientedx3, cranial nerves grossly intact. moves all 4 extremities w/o difficulty. Affect pleasant  Assessment/Plan: 1. CAD: LAD stent was patent on cath in 3/11.  He has not been on aspirin because of a questionable aspirin allergy (had hives with aspirin once but has taken aspirin since without problems).  Recent cath showed patent LAD stent and 70% stenosis in branch off OM1.  Medical management.  - No S/S ischemia  - He will continue Plavix, statin, Coreg.  2. Hyperlipidemia:  - Check lipids today.  3. Ischemic cardiomyopathy: EF 35-40% on last echo in 2017. Repeat ECHO next visit. .   -NYHA II. Volume status stable. Does not need lasix.  -- Continue Coreg 12.5 mg BID - Continue entresto to 97/103 mg BID.  - Intolerant spiro due to gynecomastia. Start 12.5 mg inspra.  - Check BMET today and in 2 weeks.   4. HTN:  Elevated. Add inspra as above. Follow up in 2 weeks for BP check .    Follow up in 2 weeks for BP check/BMET. If BP remains high will need to increased inspra to 25 mg daily.  Follow up with [name] in 3 months and repeat ECHO at that time.   [name] Becket, NP  09/04/2018

## 2018-09-04 ENCOUNTER — Other Ambulatory Visit: Payer: Self-pay

## 2018-09-04 ENCOUNTER — Ambulatory Visit (HOSPITAL_COMMUNITY)
Admission: RE | Admit: 2018-09-04 | Discharge: 2018-09-04 | Disposition: A | Discharge status: Home / Self Care | Payer: Medicare Other | Source: Ambulatory Visit | Attending: Internal Medicine | Admitting: Internal Medicine

## 2018-09-04 VITALS — BP 180/108 | HR 71 | Wt 220.2 lb

## 2018-09-04 DIAGNOSIS — E785 Hyperlipidemia, unspecified: Secondary | ICD-10-CM | POA: Diagnosis not present

## 2018-09-04 DIAGNOSIS — I10 Essential (primary) hypertension: Secondary | ICD-10-CM | POA: Diagnosis not present

## 2018-09-04 DIAGNOSIS — I255 Ischemic cardiomyopathy: Secondary | ICD-10-CM | POA: Insufficient documentation

## 2018-09-04 DIAGNOSIS — I252 Old myocardial infarction: Secondary | ICD-10-CM | POA: Diagnosis not present

## 2018-09-04 DIAGNOSIS — I2510 Atherosclerotic heart disease of native coronary artery without angina pectoris: Secondary | ICD-10-CM | POA: Insufficient documentation

## 2018-09-04 DIAGNOSIS — Z7902 Long term (current) use of antithrombotics/antiplatelets: Secondary | ICD-10-CM | POA: Insufficient documentation

## 2018-09-04 DIAGNOSIS — Z79899 Other long term (current) drug therapy: Secondary | ICD-10-CM | POA: Diagnosis not present

## 2018-09-04 DIAGNOSIS — Z955 Presence of coronary angioplasty implant and graft: Secondary | ICD-10-CM | POA: Insufficient documentation

## 2018-09-04 DIAGNOSIS — I5022 Chronic systolic (congestive) heart failure: Secondary | ICD-10-CM

## 2018-09-04 DIAGNOSIS — Z8249 Family history of ischemic heart disease and other diseases of the circulatory system: Secondary | ICD-10-CM | POA: Diagnosis not present

## 2018-09-04 LAB — BASIC METABOLIC PANEL
Anion gap: 8 (ref 5–15)
BUN: 13 mg/dL (ref 8–23)
CALCIUM: 8.8 mg/dL — AB (ref 8.9–10.3)
CHLORIDE: 107 mmol/L (ref 98–111)
CO2: 25 mmol/L (ref 22–32)
CREATININE: 0.99 mg/dL (ref 0.61–1.24)
Glucose, Bld: 154 mg/dL — ABNORMAL HIGH (ref 70–99)
Potassium: 4 mmol/L (ref 3.5–5.1)
SODIUM: 140 mmol/L (ref 135–145)

## 2018-09-04 LAB — LIPID PANEL
Cholesterol: 130 mg/dL (ref 0–200)
HDL: 26 mg/dL — ABNORMAL LOW (ref >40)
LDL Cholesterol: 62 mg/dL (ref 0–99)
TRIGLYCERIDES: 211 mg/dL — AB (ref <150)
Total CHOL/HDL Ratio: 5 RATIO
VLDL: 42 mg/dL — ABNORMAL HIGH (ref 0–40)

## 2018-09-04 MED ORDER — EPLERENONE 25 MG PO TABS: 12.5000 mg | ORAL_TABLET | Freq: Every day | ORAL | 11 refills | Status: DC

## 2018-09-04 NOTE — Patient Instructions (Addendum)
STOP Potassium  START Inspra 12.5 mg, one half tab daily  Labs today We will only contact you if something comes back abnormal or we need to make some changes. Otherwise no news is good news!   Your physician recommends that you schedule a follow-up appointment in: 2 weeks for a bp check and labs with the nurse  Your physician recommends that you schedule a follow-up appointment in: 3 months with Dr Shirlee Latch and a echo  Your physician has requested that you have an echocardiogram. Echocardiography is a painless test that uses sound waves to create images of your heart. It provides your doctor with information about the size and shape of your heart and how well your heart's chambers and valves are working. This procedure takes approximately one hour. There are no restrictions for this procedure.   Do the following things EVERYDAY: 1) Weigh yourself in the morning before breakfast. Write it down and keep it in a log. 2) Take your medicines as prescribed 3) Eat low salt foods-Limit salt (sodium) to 2000 mg per day.  4) Stay as active as you can everyday 5) Limit all fluids for the day to less than 2 liters

## 2018-09-18 ENCOUNTER — Ambulatory Visit (HOSPITAL_COMMUNITY)
Admission: RE | Admit: 2018-09-18 | Discharge: 2018-09-18 | Disposition: A | Discharge status: Home / Self Care | Payer: Medicare Other | Source: Ambulatory Visit | Attending: Internal Medicine | Admitting: Internal Medicine

## 2018-09-18 DIAGNOSIS — I5022 Chronic systolic (congestive) heart failure: Secondary | ICD-10-CM | POA: Diagnosis not present

## 2018-09-18 LAB — BASIC METABOLIC PANEL
Anion gap: 8 (ref 5–15)
BUN: 13 mg/dL (ref 8–23)
CHLORIDE: 104 mmol/L (ref 98–111)
CO2: 26 mmol/L (ref 22–32)
CREATININE: 1.06 mg/dL (ref 0.61–1.24)
Calcium: 9.2 mg/dL (ref 8.9–10.3)
GFR calc Af Amer: >60 mL/min (ref >60)
GFR calc non Af Amer: >60 mL/min (ref >60)
Glucose, Bld: 157 mg/dL — ABNORMAL HIGH (ref 70–99)
Potassium: 3.9 mmol/L (ref 3.5–5.1)
SODIUM: 138 mmol/L (ref 135–145)

## 2018-09-18 MED ORDER — EPLERENONE 25 MG PO TABS: 25.0000 mg | ORAL_TABLET | Freq: Every day | ORAL | 5 refills | Status: DC

## 2018-09-25 ENCOUNTER — Other Ambulatory Visit (HOSPITAL_COMMUNITY): Payer: Self-pay | Admitting: Cardiology

## 2018-09-25 ENCOUNTER — Ambulatory Visit (HOSPITAL_COMMUNITY)
Admission: RE | Admit: 2018-09-25 | Discharge: 2018-09-25 | Disposition: A | Discharge status: Home / Self Care | Payer: Medicare Other | Source: Ambulatory Visit | Attending: Internal Medicine | Admitting: Internal Medicine

## 2018-09-25 DIAGNOSIS — I5022 Chronic systolic (congestive) heart failure: Secondary | ICD-10-CM | POA: Insufficient documentation

## 2018-09-25 LAB — BASIC METABOLIC PANEL
Anion gap: 8 (ref 5–15)
BUN: 12 mg/dL (ref 8–23)
CHLORIDE: 106 mmol/L (ref 98–111)
CO2: 26 mmol/L (ref 22–32)
CREATININE: 1.07 mg/dL (ref 0.61–1.24)
Calcium: 8.9 mg/dL (ref 8.9–10.3)
GFR calc non Af Amer: >60 mL/min (ref >60)
Glucose, Bld: 145 mg/dL — ABNORMAL HIGH (ref 70–99)
Potassium: 4 mmol/L (ref 3.5–5.1)
Sodium: 140 mmol/L (ref 135–145)

## 2018-09-25 MED ORDER — EPLERENONE 25 MG PO TABS: 25.0000 mg | ORAL_TABLET | Freq: Every day | ORAL | 3 refills | Status: DC

## 2018-09-25 NOTE — Telephone Encounter (Signed)
At the request of the patient most recent OV faxed to [facility] [phone] attn: Dr Louis Meckel)  and refill on inspra sent to pharmacy

## 2018-12-03 ENCOUNTER — Encounter (HOSPITAL_COMMUNITY): Payer: Medicare Other | Admitting: Cardiology

## 2018-12-03 ENCOUNTER — Ambulatory Visit (HOSPITAL_COMMUNITY): Admission: RE | Admit: 2018-12-03 | Payer: Medicare Other | Source: Ambulatory Visit

## 2019-01-23 ENCOUNTER — Telehealth (HOSPITAL_COMMUNITY): Payer: Self-pay | Admitting: Cardiology

## 2019-01-23 NOTE — Telephone Encounter (Signed)
Called and left VM for pt with new appt info for 02/27/2019 for Echo and f/u with DM.  Asked pt to cb to confirm new appt info.

## 2019-02-05 ENCOUNTER — Other Ambulatory Visit (HOSPITAL_COMMUNITY): Payer: Medicare Other

## 2019-02-05 ENCOUNTER — Encounter (HOSPITAL_COMMUNITY): Payer: Medicare Other | Admitting: Cardiology

## 2019-02-27 ENCOUNTER — Other Ambulatory Visit: Payer: Self-pay

## 2019-02-27 ENCOUNTER — Ambulatory Visit (HOSPITAL_BASED_OUTPATIENT_CLINIC_OR_DEPARTMENT_OTHER)
Admission: RE | Admit: 2019-02-27 | Discharge: 2019-02-27 | Disposition: A | Discharge status: Home / Self Care | Payer: Medicare Other | Source: Ambulatory Visit | Attending: Cardiology | Admitting: Cardiology

## 2019-02-27 ENCOUNTER — Ambulatory Visit (HOSPITAL_COMMUNITY)
Admission: RE | Admit: 2019-02-27 | Discharge: 2019-02-27 | Disposition: A | Discharge status: Home / Self Care | Payer: Medicare Other | Source: Ambulatory Visit | Attending: Adult Health | Admitting: Adult Health

## 2019-02-27 ENCOUNTER — Encounter (HOSPITAL_COMMUNITY): Payer: Self-pay | Admitting: Cardiology

## 2019-02-27 VITALS — BP 160/98 | HR 63 | Wt 215.8 lb

## 2019-02-27 DIAGNOSIS — I2510 Atherosclerotic heart disease of native coronary artery without angina pectoris: Secondary | ICD-10-CM | POA: Diagnosis not present

## 2019-02-27 DIAGNOSIS — I5022 Chronic systolic (congestive) heart failure: Secondary | ICD-10-CM | POA: Diagnosis not present

## 2019-02-27 DIAGNOSIS — I252 Old myocardial infarction: Secondary | ICD-10-CM | POA: Diagnosis not present

## 2019-02-27 DIAGNOSIS — E785 Hyperlipidemia, unspecified: Secondary | ICD-10-CM | POA: Diagnosis not present

## 2019-02-27 DIAGNOSIS — Z7902 Long term (current) use of antithrombotics/antiplatelets: Secondary | ICD-10-CM | POA: Insufficient documentation

## 2019-02-27 DIAGNOSIS — I255 Ischemic cardiomyopathy: Secondary | ICD-10-CM | POA: Insufficient documentation

## 2019-02-27 DIAGNOSIS — Z79899 Other long term (current) drug therapy: Secondary | ICD-10-CM | POA: Insufficient documentation

## 2019-02-27 DIAGNOSIS — Z8249 Family history of ischemic heart disease and other diseases of the circulatory system: Secondary | ICD-10-CM | POA: Insufficient documentation

## 2019-02-27 DIAGNOSIS — Z955 Presence of coronary angioplasty implant and graft: Secondary | ICD-10-CM | POA: Insufficient documentation

## 2019-02-27 DIAGNOSIS — I10 Essential (primary) hypertension: Secondary | ICD-10-CM

## 2019-02-27 DIAGNOSIS — I110 Hypertensive heart disease with heart failure: Secondary | ICD-10-CM | POA: Diagnosis not present

## 2019-02-27 LAB — BASIC METABOLIC PANEL
Anion gap: 6 (ref 5–15)
BUN: 16 mg/dL (ref 8–23)
CO2: 25 mmol/L (ref 22–32)
Calcium: 9 mg/dL (ref 8.9–10.3)
Chloride: 106 mmol/L (ref 98–111)
Creatinine, Ser: 0.99 mg/dL (ref 0.61–1.24)
GFR calc Af Amer: >60 mL/min (ref >60)
GFR calc non Af Amer: >60 mL/min (ref >60)
Glucose, Bld: 155 mg/dL — ABNORMAL HIGH (ref 70–99)
Potassium: 4.1 mmol/L (ref 3.5–5.1)
Sodium: 137 mmol/L (ref 135–145)

## 2019-02-27 MED ORDER — CARVEDILOL 12.5 MG PO TABS: 18.7500 mg | ORAL_TABLET | Freq: Two times a day (BID) | ORAL | 3 refills | Status: DC

## 2019-02-27 NOTE — Progress Notes (Signed)
  Echocardiogram 2D Echocardiogram has been performed.  [name] 02/27/2019, 9:59 AM

## 2019-02-27 NOTE — Progress Notes (Signed)
Patient ID: [name], male   DOB: 02/12/1946, 73 y.o.   MRN: [id number] PCP: [name] HaffKeisler, Christel N, MD Cardiology: Dr. Shirlee Miller  73 y.o. with history of CAD s/p anterolateral STEMI in 1/08 and ischemic cardiomyopathy presents for followup of CHF and CAD. He had a cardiac cath with patent LAD stent in 3/11.  Echo in 12/14 showed EF 50-55% with apical septal and apical hypokinesis.    In 5/17, patient was seen by [name] for chest pain evaluation.  He was noting chest tightness when carrying a heavy load or exerting himself moderately to heavily.  In fact, he ended up retiring due to the exertional chest pain.  He had generalized fatigue that was new.    Echo was done, showing EF down to 35-40%. Cardiolite was done, showing EF 36% with large apical infarction.  LHC was done, showing patent LAD stent and 70% stenosis in a small branch off OM1, medical management.   Echo was done today and reviewed, EF 40-45% with mild LVH and mild LV dilation, normal RV systolic function.   Patient reports generally poor energy/fatigue. He is sleepy during the day.  Weight is down 5 lbs since last appointment.  BP is elevated today, SBP runs 130s-140s at home.  No chest pain.  No dyspnea walking on flat ground.  He is short of breath/fatigued with heavy exertion like shoveling dirt.  He walk 30 minutes for exercise 3-4 times/week.   ECG (personally reviewed): NSR, old anterior MI, old inferior MI  Labs (11/15): LDL 87, HDL 36, HCT 45.5, K 3.4, creatinine 1.0, LFTs normal Labs (5/17): LDL 73, HDL 28, TSH normal, K 3.4, creatinine 1.06, HCT 44.6 Labs (6/17): BNP 81, K 3.2, creatinine 1.18 Labs (7/17): K 4, creatinine 0.95 Labs (2/20): K 4, creatinine 1.07, LDL 62, HDL 26  PMH: 1. CAD: Anterolateral STEMI in 1/08 with BMS to LAD (had luminals in LCx and RCA). Myoview in 1/10 showed EF 45% with anteroseptal infarction and mild peri-infarction ischemia.  LHC (3/11) with patent LAD stent, EF 40-45%.  -  ETT-Cardiolite (6/17) with EF 36%, 6 min exercise, hypertensive BP response, large apical infarct.   - LHC (6/17): patent LAD stent, 70% stenosis small branch off OM1.  2. Ischemic cardiomyopathy: EF 30% at time of STEMI.  Most recent study was LV-gram in 3/11 with EF 40-45%.  Echo (12/14) showed EF 50-55%, apical septal hypokinesis an apical hypokinesis, no LV thrombus.  - Echo (6/17) with EF 35-40%, septal/apical akinesis, moderate LV dilation, mild MR.  - Echo (7/20): EF 40-45% with mild LVH and mild LV dilation, normal RV systolic function. 3. HTN 4. Hyperlipidemia 5. ABIs in 10/14 were normal. 6. Abdominal US in 10/14 showed no AAA.  7. Aspirin allergy: Questionable.  Had hives once after taking aspirin but has taken it since without problems.   8. Sleep study in 2011 with no OSA  SH: Married, has children, lives in [place], retired, nonsmoker.   FH: CAD  ROS: All systems reviewed and negative except as per HPI.   Current Outpatient Medications  Medication Sig Dispense Refill  . carvedilol (COREG) 12.5 MG tablet Take 1.5 tablets (18.75 mg total) by mouth 2 (two) times daily with a meal. 270 tablet 3  . clopidogrel (PLAVIX) 75 MG tablet Take 75 mg by mouth daily with breakfast.    . eplerenone (INSPRA) 25 MG tablet Take 1 tablet (25 mg total) by mouth daily. 90 tablet 3  . sacubitril-valsartan (ENTRESTO) 97-103  MG Take 1 tablet by mouth 2 (two) times daily. 60 tablet 3  . simvastatin (ZOCOR) 40 MG tablet Take 40 mg by mouth daily.     No current facility-administered medications for this encounter.    BP (!) 160/98   Pulse 63   Wt 97.9 kg (215 lb 12.8 oz)   SpO2 96%   BMI 29.27 kg/m  General: NAD Neck: No JVD, no thyromegaly or thyroid nodule.  Lungs: Clear to auscultation bilaterally with normal respiratory effort. CV: Nondisplaced PMI.  Heart regular S1/S2, no S3/S4, no murmur.  No peripheral edema.  No carotid bruit.  Normal pedal pulses.  Abdomen: Soft,  nontender, no hepatosplenomegaly, no distention.  Skin: Intact without lesions or rashes.  Neurologic: Alert and oriented x 3.  Psych: Normal affect. Extremities: No clubbing or cyanosis.  HEENT: Normal.   Assessment/Plan: 1. CAD: LAD stent was patent on cath in 3/11.  He has not been on aspirin because of a questionable aspirin allergy (had hives with aspirin once but has taken aspirin since without problems).  Last cath in 6/17 showed patent LAD stent and 70% stenosis in branch off OM1.  Medical management. No chest pain.  - He will continue Plavix, statin, Coreg.  2. Hyperlipidemia: Good lipids in 2/20.  Continue simvastatin. 3. Chronic systolic CHF: Ischemic cardiomyopathy.  EF 35-40% on 6/17 echo.  Echo was done today and reviewed, EF 40-45%.  He does not appear volume overloaded on exam. NYHA class II symptoms.  - Continue eplerenone 25 mg daily.  Will not increase as he feels like eplerenone makes him feel fatigued when he takes it.  I asked him to move it to the evening.  - Increase Coreg to 18.75 mg bid.   - Continue Entresto 97/103 bid.  - BMET today.  4. HTN: BP high, increasing Coreg.  5. Suspect OSA: Daytime sleepiness/fatigue and snoring.  I will order a home sleep study.   Followup in 4 months.    [name] 02/27/2019

## 2019-02-27 NOTE — Patient Instructions (Signed)
Labs were done today. We will call you with any ABNORMAL results. No news is good news!  EKG was completed.   INCREASE Coreg to 18.75 mg (1.5 tabs) twice a day.  Your physician recommends that you schedule a follow-up appointment in: 4 months.  At the [facility], you and your health needs are our priority. As part of our continuing mission to provide you with exceptional heart care, we have created designated Provider Care Teams. These Care Teams include your primary Cardiologist (physician) and Advanced Practice Providers (APPs- Physician Assistants and Nurse Practitioners) who all work together to provide you with the care you need, when you need it.   You may see any of the following providers on your designated Care Team at your next follow up: Marland Kitchen Dr Glori Bickers . Dr Loralie Champagne . Darrick Grinder, NP

## 2019-02-28 ENCOUNTER — Other Ambulatory Visit (HOSPITAL_COMMUNITY): Payer: Self-pay

## 2019-02-28 DIAGNOSIS — R5382 Chronic fatigue, unspecified: Secondary | ICD-10-CM

## 2019-03-14 ENCOUNTER — Encounter (INDEPENDENT_AMBULATORY_CARE_PROVIDER_SITE_OTHER): Payer: Medicare Other | Admitting: Cardiology

## 2019-03-14 DIAGNOSIS — G4733 Obstructive sleep apnea (adult) (pediatric): Secondary | ICD-10-CM

## 2019-03-16 NOTE — Procedures (Signed)
   Sleep Study Report  Patient Information Name: [name]  ID: [id number] Birth Date: Oct 20, 1989  Age: 73  Gender: Male Insurer: BMI: 31.4 (W=231 lb, H=6' 0'') Study Date:03/14/2019 Referring Physician:  Loralie Champagne, MD  Summary & Diagnosis  TEST DESCRIPTION: Home sleep apnea testing was completed using the [place], a Type 1device, utilizing peripheral arterial tonometry (PAT), chest movement, actigraphy, pulse oximetry, pulse rate, body position and snore. AHI was calculated with apnea and hypopnea using valid sleep time as the denominator. RDI includes apneas, hypopneas, and RERAs. The data acquired and the scoring of sleep and all associated events were performed in accordance with the recommended standards and specifications as outlined in the AASM Manual for the Scoring of Sleep and Associated Events 2.2.0 (2015).  DIAGNOSIS 1. Mild Obstructive Sleep Apnea (G47.33) with AHI 8.8/hr but severe during REM sleep with AHI 35.6/hr. 2. No [place] apnea was noted. 3. Mild Oxygen desaturations with O2 sats at low as 87% with time spent with O2 sats < 84% at 0.7 minutes. 4. Mildly prolonged sleep onset latency at 32 minutes and prolonged REM sleep latency at 239minutes. 5. Severe snoring was noted.  RECOMMENDATIONS  1. Findings are consistent with mild OSA. For symptomatic mild obstructive sleep apnea, patient preference and compliance impacts efficacious outcomes. Therapeutic options include:   a. The patient may benefit from the use of a nocturnal mandibular repositioning appliance. If that line of therapy is to be pursued the patient should be evaluated by a dentist trained in the treatment of sleep related breathing disorders.   b. An ENT consultation which may be useful for specific causes of obstruction and possible treatment options .   c. Consider treatment with nasal continuous positive airway pressure ( CPAP). If the patient chooses CPAP therapy, a nocturnal PSG with  CPAP titration is recommended. As an alternative, an Auto PAP with pressure range 5-20cm H2O with download is an option.  2. Weight loss may be of benefit in reducing the severity of respiratory events and snoring .  3. Routine follow-up efficacy testing should be performed.   Report prepared by: Signature: Fransico Him Electronically Signed: Mar 16, 2019

## 2019-03-17 ENCOUNTER — Telehealth: Payer: Self-pay | Admitting: *Deleted

## 2019-03-17 ENCOUNTER — Ambulatory Visit: Payer: Medicare Other

## 2019-03-17 ENCOUNTER — Other Ambulatory Visit: Payer: Self-pay

## 2019-03-17 NOTE — Telephone Encounter (Signed)
Informed patient of sleep study results and patient understanding was verbalized. Patient understands his sleep study showed they have sleep apnea and recommend auto CPAP titration through [facility]. Orders have been placed in Epic. Please set 10 week OV with me.  Upon patient request DME selection is [facility] Patient understands he will be contacted by [facility] to set up his cpap. Patient understands to call if [facility] does not contact him with new setup in a timely manner. Patient understands they will be called once confirmation has been received from [facility] that they have received their new machine to schedule 10 week follow up appointment.  [facility] notified of new cpap order  Please add to airview Patient was grateful for the call and thanked me.

## 2019-03-17 NOTE — Telephone Encounter (Signed)
Called patient Left detailed message on voicemail and informed patient to call back to receive his sleep study results.Marland Kitchen

## 2019-03-17 NOTE — Telephone Encounter (Signed)
-----   Message from Sueanne Margarita, MD sent at 03/16/2019  6:40 PM EDT ----- Please let patient know that they have sleep apnea and recommend auto CPAP titration through [facility].  Orders have been placed in Epic. Please set 10 week OV with me.

## 2019-03-18 ENCOUNTER — Telehealth: Payer: Self-pay | Admitting: *Deleted

## 2019-03-18 NOTE — Telephone Encounter (Signed)
Patient Name: [name]        DOB: 1946/06/22      Height: 6'0"    CVELFY:101 LB  Office Name:[facility]         Referring Provider: dalton mcLean  Today's Date:03/18/19  Date:   STOP BANG RISK ASSESSMENT S (snore) Have you been told that you snore?     YES   T (tired) Are you often tired, fatigued, or sleepy during the day?   YES  O (obstruction) Do you stop breathing, choke, or gasp during sleep? NO   P (pressure) Do you have or are you being treated for high blood pressure? YES   B (BMI) Is your body index greater than 35 kg/m? NO   A (age) Are you 6 years old or older? YES   N (neck) Do you have a neck circumference greater than 16 inches?   NO   G (gender) Are you a male? YES   TOTAL STOP/BANG "YES" ANSWERS                                                                        For Office Use Only              Procedure Order Form    YES to 3+ Stop Bang questions OR two clinical symptoms - patient qualifies for WatchPAT (CPT 95800)     Submit: This Form + Patient Face Sheet + Clinical Note via CloudPAT or Fax: [phone]         Clinical Notes: Will consult Sleep Specialist and refer for management of therapy due to patient increased risk of Sleep Apnea. Ordering a sleep study due to the following two clinical symptoms: Excessive daytime sleepiness G47.10 / Gastroesophageal reflux K21.9 / Nocturia R35.1 / Morning Headaches G44.221 / Difficulty concentrating R41.840 / Memory problems or poor judgment G31.84 / Personality changes or irritability R45.4 / Loud snoring R06.83 / Depression F32.9 / Unrefreshed by sleep G47.8 / Impotence N52.9 / History of high blood pressure R03.0 / Insomnia G47.00    I understand that I am proceeding with a home sleep apnea test as ordered by my treating physician. I understand that untreated sleep apnea is a serious cardiovascular risk factor and it is my responsibility to perform the test and seek management for sleep apnea. I will  be contacted with the results and be managed for sleep apnea by a local sleep physician. I will be receiving equipment and further instructions from [name] I shall promptly ship back the equipment via the included mailing label. I understand my insurance will be billed for the test and as the patient I am responsible for any insurance related out-of-pocket costs incurred. I have been provided with written instructions and can call for additional video or telephonic instruction, with 24-hour availability of qualified personnel to answer any questions: Patient Help Desk [phone].  Patient [place] ______________________________________________________   Date________8/18/20______________ Patient Telemedicine Verbal Consent

## 2019-03-18 NOTE — Telephone Encounter (Signed)
Patient needs a stop bang questionnaire, I will send to [facility]. 

## 2019-05-02 NOTE — Telephone Encounter (Signed)
Patient has decided to hold off on getting his cpap at this time. He was advised by the [facility] representative that he was not severe and has the option to get his cpap or not.

## 2019-06-06 DIAGNOSIS — H2513 Age-related nuclear cataract, bilateral: Secondary | ICD-10-CM | POA: Diagnosis not present

## 2019-06-30 ENCOUNTER — Ambulatory Visit (HOSPITAL_COMMUNITY)
Admission: RE | Admit: 2019-06-30 | Discharge: 2019-06-30 | Disposition: A | Discharge status: Home / Self Care | Payer: Medicare Other | Source: Ambulatory Visit | Attending: Cardiology | Admitting: Cardiology

## 2019-06-30 ENCOUNTER — Other Ambulatory Visit: Payer: Self-pay

## 2019-06-30 DIAGNOSIS — E782 Mixed hyperlipidemia: Secondary | ICD-10-CM

## 2019-06-30 DIAGNOSIS — I2510 Atherosclerotic heart disease of native coronary artery without angina pectoris: Secondary | ICD-10-CM | POA: Diagnosis not present

## 2019-06-30 DIAGNOSIS — I5022 Chronic systolic (congestive) heart failure: Secondary | ICD-10-CM | POA: Diagnosis not present

## 2019-06-30 MED ORDER — CARVEDILOL 25 MG PO TABS: 25.0000 mg | ORAL_TABLET | Freq: Two times a day (BID) | ORAL | 6 refills | Status: DC

## 2019-06-30 NOTE — Progress Notes (Signed)
Heart Failure TeleHealth Note  Due to national recommendations of social distancing due to [place] 19, Audio/video telehealth visit is felt to be most appropriate for this patient at this time.  See MyChart message from today for patient consent regarding telehealth for [facility].  Date:  06/30/2019   ID:  [name], DOB [birth year], MRN [id number]  Location: Home  Provider location: [facility] [facility] Type of Visit: Established patient   PCP:  Meredith Mody, MD  Cardiologist:  Dr. Aundra Dubin  Chief Complaint: Fatigue.   History of Present Illness: [name] is a 73 y.o. male who presents via audio/video conferencing for a telehealth visit today.     He denies symptoms worrisome for COVID 19.   Patient has a history of CAD s/p anterolateral STEMI in 1/08 and ischemic cardiomyopathy. He had a cardiac cath with patent LAD stent in 3/11.  Echo in 12/14 showed EF 50-55% with apical septal and apical hypokinesis.    In 5/17, patient was seen by Estella Husk for chest pain evaluation.  He was noting chest tightness when carrying a heavy load or exerting himself moderately to heavily.  In fact, he ended up retiring due to the exertional chest pain.  He had generalized fatigue that was new.    Echo was done in 5/17,  showing EF down to 35-40%. Cardiolite was done, showing EF 36% with large apical infarction.  LHC was done, showing patent LAD stent and 70% stenosis in a small branch off OM1, medical management.   Echo in 7/20 showed EF 40-45% with mild LVH and mild LV dilation, normal RV systolic function.   Patient has been doing ok recently.  He does not have the endurance that he did in the past, but generally able to do what he wants to do without significant exertional dyspnea.  No problems walking on flat ground.  No chest pain.  No orthopnea/PND.  He is taking all his meds.    Labs (11/15): LDL 87, HDL 36, HCT 45.5, K 3.4, creatinine 1.0, LFTs normal Labs  (5/17): LDL 73, HDL 28, TSH normal, K 3.4, creatinine 1.06, HCT 44.6 Labs (6/17): BNP 81, K 3.2, creatinine 1.18 Labs (7/17): K 4, creatinine 0.95 Labs (2/20): K 4, creatinine 1.07, LDL 62, HDL 26 Labs (7/20): K 4.1, creatinine 0.99  PMH: 1. CAD: Anterolateral STEMI in 1/08 with BMS to LAD (had luminals in LCx and RCA). Myoview in 1/10 showed EF 45% with anteroseptal infarction and mild peri-infarction ischemia.  LHC (3/11) with patent LAD stent, EF 40-45%.  - ETT-Cardiolite (6/17) with EF 36%, 6 min exercise, hypertensive BP response, large apical infarct.   - LHC (6/17): patent LAD stent, 70% stenosis small branch off OM1.  2. Ischemic cardiomyopathy: EF 30% at time of STEMI.  Most recent study was LV-gram in 3/11 with EF 40-45%.  Echo (12/14) showed EF 50-55%, apical septal hypokinesis an apical hypokinesis, no LV thrombus.  - Echo (6/17) with EF 35-40%, septal/apical akinesis, moderate LV dilation, mild MR.  - Echo (7/20): EF 40-45% with mild LVH and mild LV dilation, normal RV systolic function. 3. HTN 4. Hyperlipidemia 5. ABIs in 10/14 were normal. 6. Abdominal US in 10/14 showed no AAA.  7. Aspirin allergy: Questionable.  Had hives once after taking aspirin but has taken it since without problems.   8. 8/20 sleep study showed mild OSA.   SH: Married, has children, lives in [place], retired, nonsmoker.   FH: CAD  ROS: All systems reviewed and negative except as per HPI.   Current Outpatient Medications  Medication Sig Dispense Refill  . carvedilol (COREG) 25 MG tablet Take 1 tablet (25 mg total) by mouth 2 (two) times daily with a meal. 60 tablet 6  . clopidogrel (PLAVIX) 75 MG tablet Take 75 mg by mouth daily with breakfast.    . eplerenone (INSPRA) 25 MG tablet Take 1 tablet (25 mg total) by mouth daily. 90 tablet 3  . sacubitril-valsartan (ENTRESTO) 97-103 MG Take 1 tablet by mouth 2 (two) times daily. 60 tablet 3  . simvastatin (ZOCOR) 40 MG tablet Take 40 mg by  mouth daily.     No current facility-administered medications for this encounter.    Exam:  (Video/Tele Health Call; Exam is subjective and or/visual.) BP 130/74 (on home cuff) General:  Speaks in full sentences. No resp difficulty. Lungs: Normal respiratory effort with conversation.  Abdomen: Non-distended per patient report Extremities: Pt denies edema. Neuro: Alert & oriented x 3.   Assessment/Plan: 1. CAD: LAD stent was patent on cath in 3/11.  He has not been on aspirin because of a questionable aspirin allergy (had hives with aspirin once but has taken aspirin since without problems).  Last cath in 6/17 showed patent LAD stent and 70% stenosis in branch off OM1.  Medical management. No chest pain.  - He will continue Plavix, statin, Coreg.  2. Hyperlipidemia: He is on simvastatin.  - Check lipids today.  3. Chronic systolic CHF: Ischemic cardiomyopathy.  EF 35-40% on 6/17 echo.  Echo in 7/20 showed EF 40-45%.  He does not appear volume overloaded on exam. NYHA class II symptoms.  - Continue eplerenone 25 mg daily.  Will not increase as he feels like eplerenone makes him feel fatigued when he takes it.   - Increase Coreg to 25 mg bid.   - Continue Entresto 97/103 bid.  - BMET today.  4. HTN: BP controlled.   COVID screen The patient does not have any symptoms that suggest any further testing/ screening at this time.  Social distancing reinforced today.  Patient Risk: After full review of this patients clinical status, I feel that they are at moderate risk for cardiac decompensation at this time.  Relevant cardiac medications were reviewed at length with the patient today. The patient does not have concerns regarding their medications at this time.   Recommended follow-up:  4 months  Today, I have spent 16 minutes with the patient with telehealth technology discussing the above issues .    Signed, Marca Ancona, MD  06/30/2019  [facility] [address] [facility] [place] [address] [phone] (office) [phone] (fax)

## 2019-06-30 NOTE — Patient Instructions (Signed)
Increase Carvedilol to 25 mg Twice daily   Fasting labs on Thursday 07/03/2019 at 9:30 AM, parking code [place] recommends that you schedule a follow-up appointment in: 4 months:   Monday 10/20/2019 at 9:40 AM with Dr Aundra Dubin, parking code [phone]  If you have any questions or concerns before your next appointment please send [place] a message through [place] or call our office at [phone].  At the [facility], you and your health needs are our priority. As part of our continuing mission to provide you with exceptional heart care, we have created designated Provider Care Teams. These Care Teams include your primary Cardiologist (physician) and Advanced Practice Providers (APPs- Physician Assistants and Nurse Practitioners) who all work together to provide you with the care you need, when you need it.   You may see any of the following providers on your designated Care Team at your next follow up: Marland Kitchen Dr Glori Bickers . Dr Loralie Champagne . Darrick Grinder, NP . Lyda Jester, PA   Please be sure to bring in all your medications bottles to every appointment.

## 2019-06-30 NOTE — Progress Notes (Signed)
Orders per Dr Aundra Dubin:  1. Increase Carvedilol to 25 mg Twice daily  2. Bmet, lipid soon 3. F/u 4 months.  Called and spoke w/pt via phone, he is aware, agreeable and verbalizes understanding.  New rx for carvedilol sent in, appointments schedule, AVS and appt cards mailed to pt.

## 2019-07-03 ENCOUNTER — Other Ambulatory Visit: Payer: Self-pay

## 2019-07-03 ENCOUNTER — Ambulatory Visit (HOSPITAL_COMMUNITY)
Admission: RE | Admit: 2019-07-03 | Discharge: 2019-07-03 | Disposition: A | Discharge status: Home / Self Care | Payer: Medicare Other | Source: Ambulatory Visit | Attending: Cardiology | Admitting: Cardiology

## 2019-07-03 DIAGNOSIS — I5022 Chronic systolic (congestive) heart failure: Secondary | ICD-10-CM | POA: Insufficient documentation

## 2019-07-03 DIAGNOSIS — E782 Mixed hyperlipidemia: Secondary | ICD-10-CM | POA: Insufficient documentation

## 2019-07-03 LAB — LIPID PANEL
Cholesterol: 149 mg/dL (ref 0–200)
HDL: 29 mg/dL — ABNORMAL LOW (ref >40)
LDL Cholesterol: 59 mg/dL (ref 0–99)
Total CHOL/HDL Ratio: 5.1 RATIO
Triglycerides: 304 mg/dL — ABNORMAL HIGH (ref <150)
VLDL: 61 mg/dL — ABNORMAL HIGH (ref 0–40)

## 2019-07-03 LAB — BASIC METABOLIC PANEL
Anion gap: 8 (ref 5–15)
BUN: 9 mg/dL (ref 8–23)
CO2: 28 mmol/L (ref 22–32)
Calcium: 8.6 mg/dL — ABNORMAL LOW (ref 8.9–10.3)
Chloride: 103 mmol/L (ref 98–111)
Creatinine, Ser: 1.05 mg/dL (ref 0.61–1.24)
GFR calc Af Amer: >60 mL/min (ref >60)
GFR calc non Af Amer: >60 mL/min (ref >60)
Glucose, Bld: 162 mg/dL — ABNORMAL HIGH (ref 70–99)
Potassium: 3.8 mmol/L (ref 3.5–5.1)
Sodium: 139 mmol/L (ref 135–145)

## 2019-07-07 ENCOUNTER — Telehealth (HOSPITAL_COMMUNITY): Payer: Self-pay

## 2019-07-07 NOTE — Telephone Encounter (Signed)
Pt aware of results. Pt defers starting a new medicine at this time.  He would like to do some dietary changes before starting a new medicine.

## 2019-07-07 NOTE — Telephone Encounter (Signed)
-----   Message from Larey Dresser, MD sent at 07/06/2019 11:35 PM EST ----- Good LDL but TGs elevated.  Start Vascepa 2 g bid with lipids 2 months.

## 2019-10-20 ENCOUNTER — Encounter (HOSPITAL_COMMUNITY): Payer: Medicare Other | Admitting: Cardiology

## 2019-11-08 DIAGNOSIS — Z23 Encounter for immunization: Secondary | ICD-10-CM | POA: Diagnosis not present

## 2019-12-19 ENCOUNTER — Ambulatory Visit (HOSPITAL_COMMUNITY)
Admission: RE | Admit: 2019-12-19 | Discharge: 2019-12-19 | Disposition: A | Discharge status: Home / Self Care | Payer: Medicare Other | Source: Ambulatory Visit | Attending: Cardiology | Admitting: Cardiology

## 2019-12-19 ENCOUNTER — Other Ambulatory Visit: Payer: Self-pay

## 2019-12-19 ENCOUNTER — Encounter (HOSPITAL_COMMUNITY): Payer: Self-pay | Admitting: Cardiology

## 2019-12-19 VITALS — BP 144/78 | HR 66 | Ht 72.0 in | Wt 219.0 lb

## 2019-12-19 DIAGNOSIS — E785 Hyperlipidemia, unspecified: Secondary | ICD-10-CM | POA: Diagnosis not present

## 2019-12-19 DIAGNOSIS — I2510 Atherosclerotic heart disease of native coronary artery without angina pectoris: Secondary | ICD-10-CM | POA: Diagnosis not present

## 2019-12-19 DIAGNOSIS — Z79899 Other long term (current) drug therapy: Secondary | ICD-10-CM | POA: Insufficient documentation

## 2019-12-19 DIAGNOSIS — I110 Hypertensive heart disease with heart failure: Secondary | ICD-10-CM | POA: Diagnosis not present

## 2019-12-19 DIAGNOSIS — Z7901 Long term (current) use of anticoagulants: Secondary | ICD-10-CM | POA: Insufficient documentation

## 2019-12-19 DIAGNOSIS — I255 Ischemic cardiomyopathy: Secondary | ICD-10-CM | POA: Insufficient documentation

## 2019-12-19 DIAGNOSIS — I5022 Chronic systolic (congestive) heart failure: Secondary | ICD-10-CM | POA: Insufficient documentation

## 2019-12-19 DIAGNOSIS — Z955 Presence of coronary angioplasty implant and graft: Secondary | ICD-10-CM | POA: Diagnosis not present

## 2019-12-19 DIAGNOSIS — I252 Old myocardial infarction: Secondary | ICD-10-CM | POA: Diagnosis not present

## 2019-12-19 DIAGNOSIS — Z7902 Long term (current) use of antithrombotics/antiplatelets: Secondary | ICD-10-CM | POA: Diagnosis not present

## 2019-12-19 NOTE — Patient Instructions (Signed)
It was great to see you today! No medication changes are needed at this time.  Your physician recommends that you schedule a follow-up appointment in: 6 months with Dr Shirlee Latch and echo  Your physician has requested that you have an echocardiogram. Echocardiography is a painless test that uses sound waves to create images of your heart. It provides your doctor with information about the size and shape of your heart and how well your heart's chambers and valves are working. This procedure takes approximately one hour. There are no restrictions for this procedure.  Do the following things EVERYDAY: 1) Weigh yourself in the morning before breakfast. Write it down and keep it in a log. 2) Take your medicines as prescribed 3) Eat low salt foods--Limit salt (sodium) to 2000 mg per day.  4) Stay as active as you can everyday 5) Limit all fluids for the day to less than 2 liters   At the [facility], you and your health needs are our priority. As part of our continuing mission to provide you with exceptional heart care, we have created designated Provider Care Teams. These Care Teams include your primary Cardiologist (physician) and Advanced Practice Providers (APPs- Physician Assistants and Nurse Practitioners) who all work together to provide you with the care you need, when you need it.   You may see any of the following providers on your designated Care Team at your next follow up: Marland Kitchen Dr Arvilla Meres . Dr Marca Ancona . Tonye Becket, NP . Robbie Lis, PA . Karle Plumber, PharmD   Please be sure to bring in all your medications bottles to every appointment.

## 2019-12-21 NOTE — Progress Notes (Signed)
ID:  [name], DOB [birth year], MRN [id number]   Provider location: [facility] Type of Visit: Established patient   PCP:  Bonnielee Haff, MD  Cardiologist:  Dr. Shirlee Latch   History of Present Illness: [name] is a 74 y.o. male who has a history of CAD s/p anterolateral STEMI in 1/08 and ischemic cardiomyopathy. He had a cardiac cath with patent LAD stent in 3/11.  Echo in 12/14 showed EF 50-55% with apical septal and apical hypokinesis.    In 5/17, patient was seen by [name] for chest pain evaluation.  He was noting chest tightness when carrying a heavy load or exerting himself moderately to heavily.  In fact, he ended up retiring due to the exertional chest pain.  He had generalized fatigue that was new.    Echo was done in 5/17,  showing EF down to 35-40%. Cardiolite was done, showing EF 36% with large apical infarction.  LHC was done, showing patent LAD stent and 70% stenosis in a small branch off OM1, medical management.   Echo in 7/20 showed EF 40-45% with mild LVH and mild LV dilation, normal RV systolic function.   He returns for followup of CAD and ischemic cardiomyopathy.  He has been doing well, no exertional dyspnea or chest pain.  No claudication.  He walks on a treadmill for exercise and walks at the park. BP high in the office today, but checks daily at home with SBSP 110s-120s.     Labs (11/15): LDL 87, HDL 36, HCT 45.5, K 3.4, creatinine 1.0, LFTs normal Labs (5/17): LDL 73, HDL 28, TSH normal, K 3.4, creatinine 1.06, HCT 44.6 Labs (6/17): BNP 81, K 3.2, creatinine 1.18 Labs (7/17): K 4, creatinine 0.95 Labs (2/20): K 4, creatinine 1.07, LDL 62, HDL 26 Labs (7/20): K 4.1, creatinine 0.99 Labs (12/20): K 3.8, creatinine 1.05, LDL 59 Labs (4/21): LDL 73, HDL 30, TGs 120, hgb 15.9, K 4, creatinine 1.25, AST/ALT normal  ECG (personally reviewed): NSR, 1st degree AFB, old ASMI  PMH: 1. CAD: Anterolateral STEMI in 1/08 with BMS to LAD  (had luminals in LCx and RCA). Myoview in 1/10 showed EF 45% with anteroseptal infarction and mild peri-infarction ischemia.  LHC (3/11) with patent LAD stent, EF 40-45%.  - ETT-Cardiolite (6/17) with EF 36%, 6 min exercise, hypertensive BP response, large apical infarct.   - LHC (6/17): patent LAD stent, 70% stenosis small branch off OM1.  2. Ischemic cardiomyopathy: EF 30% at time of STEMI.  Most recent study was LV-gram in 3/11 with EF 40-45%.  Echo (12/14) showed EF 50-55%, apical septal hypokinesis an apical hypokinesis, no LV thrombus.  - Echo (6/17) with EF 35-40%, septal/apical akinesis, moderate LV dilation, mild MR.  - Echo (7/20): EF 40-45% with mild LVH and mild LV dilation, normal RV systolic function. 3. HTN 4. Hyperlipidemia 5. ABIs in 10/14 were normal. 6. Abdominal US in 10/14 showed no AAA.  7. Aspirin allergy: Questionable.  Had hives once after taking aspirin but has taken it since without problems.   8. 8/20 sleep study showed mild OSA.   SH: Married, has children, lives in [place], retired, nonsmoker.   FH: CAD  ROS: All systems reviewed and negative except as per HPI.   Current Outpatient Medications  Medication Sig Dispense Refill  . carvedilol (COREG) 25 MG tablet Take 1 tablet (25 mg total) by mouth 2 (two) times daily with a meal. 60 tablet 6  .  clopidogrel (PLAVIX) 75 MG tablet Take 75 mg by mouth daily with breakfast.    . eplerenone (INSPRA) 25 MG tablet Take 1 tablet (25 mg total) by mouth daily. 90 tablet 3  . sacubitril-valsartan (ENTRESTO) 97-103 MG Take 1 tablet by mouth 2 (two) times daily. 60 tablet 3  . simvastatin (ZOCOR) 40 MG tablet Take 40 mg by mouth daily.     No current facility-administered medications for this encounter.   Exam:   BP (!) 144/78   Pulse 66   Ht 6' (1.829 m)   Wt 99.3 kg (219 lb)   SpO2 95%   BMI 29.70 kg/m  General: NAD Neck: No JVD, no thyromegaly or thyroid nodule.  Lungs: Clear to auscultation bilaterally  with normal respiratory effort. CV: Nondisplaced PMI.  Heart regular S1/S2, no S3/S4, no murmur.  No peripheral edema.  No carotid bruit.  Normal pedal pulses.  Abdomen: Soft, nontender, no hepatosplenomegaly, no distention.  Skin: Intact without lesions or rashes.  Neurologic: Alert and oriented x 3.  Psych: Normal affect. Extremities: No clubbing or cyanosis.  HEENT: Normal.   Assessment/Plan: 1. CAD: LAD stent was patent on cath in 3/11.  He has not been on aspirin because of a questionable aspirin allergy (had hives with aspirin once but has taken aspirin since without problems).  Last cath in 6/17 showed patent LAD stent and 70% stenosis in branch off OM1.  Medical management. No chest pain.  - He will continue Plavix, statin, Coreg.  2. Hyperlipidemia: He is on simvastatin.  - LDL acceptable in 4/21.   3. Chronic systolic CHF: Ischemic cardiomyopathy.  EF 35-40% on 6/17 echo.  Echo in 7/20 showed EF 40-45%.  He does not appear volume overloaded on exam. NYHA class II symptoms.  - Continue eplerenone 25 mg daily, he has not tolerated higher dose.   - Continue Coreg 25 mg bid.   - Continue Entresto 97/103 bid.  - BMET today.  - I will arrange for echo at followup in 6 months.  4. HTN: BP controlled on home readings though elevated in the office today.   Followup in 6 months with echo  Loralie Champagne 12/21/2019  [facility] [address] [facility] [place] [address] [phone] (office) [phone] (fax)

## 2020-01-15 ENCOUNTER — Other Ambulatory Visit (HOSPITAL_COMMUNITY): Payer: Self-pay | Admitting: Cardiology

## 2020-04-30 DIAGNOSIS — U071 COVID-19: Secondary | ICD-10-CM

## 2020-04-30 HISTORY — DX: COVID-19: U07.1

## 2020-05-22 ENCOUNTER — Ambulatory Visit (HOSPITAL_COMMUNITY)
Admission: RE | Admit: 2020-05-22 | Discharge: 2020-05-22 | Disposition: A | Discharge status: Home / Self Care | Payer: Medicare Other | Source: Ambulatory Visit | Attending: Pulmonary Disease | Admitting: Pulmonary Disease

## 2020-05-22 ENCOUNTER — Encounter: Payer: Self-pay | Admitting: Nurse Practitioner

## 2020-05-22 ENCOUNTER — Other Ambulatory Visit: Payer: Self-pay | Admitting: Nurse Practitioner

## 2020-05-22 DIAGNOSIS — I10 Essential (primary) hypertension: Secondary | ICD-10-CM

## 2020-05-22 DIAGNOSIS — Z23 Encounter for immunization: Secondary | ICD-10-CM | POA: Insufficient documentation

## 2020-05-22 DIAGNOSIS — I5020 Unspecified systolic (congestive) heart failure: Secondary | ICD-10-CM

## 2020-05-22 DIAGNOSIS — I2510 Atherosclerotic heart disease of native coronary artery without angina pectoris: Secondary | ICD-10-CM

## 2020-05-22 DIAGNOSIS — U071 COVID-19: Secondary | ICD-10-CM

## 2020-05-22 DIAGNOSIS — I255 Ischemic cardiomyopathy: Secondary | ICD-10-CM | POA: Insufficient documentation

## 2020-05-22 MED ORDER — EPINEPHRINE 0.3 MG/0.3ML IJ SOAJ: 0.3000 mg | Freq: Once | INTRAMUSCULAR | Status: DC | PRN

## 2020-05-22 MED ORDER — ALBUTEROL SULFATE HFA 108 (90 BASE) MCG/ACT IN AERS: 2.0000 | INHALATION_SPRAY | Freq: Once | RESPIRATORY_TRACT | Status: DC | PRN

## 2020-05-22 MED ORDER — SODIUM CHLORIDE 0.9 % IV SOLN: Freq: Once | INTRAVENOUS | Status: AC

## 2020-05-22 MED ORDER — SODIUM CHLORIDE 0.9 % IV SOLN: INTRAVENOUS | Status: DC | PRN

## 2020-05-22 MED ORDER — FAMOTIDINE IN NACL 20-0.9 MG/50ML-% IV SOLN: 20.0000 mg | Freq: Once | INTRAVENOUS | Status: DC | PRN

## 2020-05-22 MED ORDER — DIPHENHYDRAMINE HCL 50 MG/ML IJ SOLN: 50.0000 mg | Freq: Once | INTRAMUSCULAR | Status: DC | PRN

## 2020-05-22 MED ORDER — METHYLPREDNISOLONE SODIUM SUCC 125 MG IJ SOLR: 125.0000 mg | Freq: Once | INTRAMUSCULAR | Status: DC | PRN

## 2020-05-22 NOTE — Discharge Instructions (Signed)

## 2020-05-22 NOTE — Progress Notes (Signed)
  Diagnosis: COVID-19  Physician: Patrick Wright, MD  Procedure: [facility] Infusion [facility] Med: bamlanivimab\etesevimab infusion - Provided patient with bamlanimivab\etesevimab fact sheet for patients, parents and caregivers prior to infusion.  Complications: No immediate complications noted.  Discharge: Discharged home   [name] 05/22/2020   

## 2020-05-22 NOTE — Progress Notes (Signed)
I connected by phone with [name] on 05/22/2020 at 10:48 AM to discuss the potential use of a new treatment for mild to moderate COVID-19 viral infection in non-hospitalized patients.  This patient is a 74 y.o. male that meets the FDA criteria for Emergency Use Authorization of COVID monoclonal antibody casirivimab/imdevimab.  Has a (+) direct SARS-CoV-2 viral test result  Has mild or moderate COVID-19   Is NOT hospitalized due to COVID-19  Is within 10 days of symptom onset  Has at least one of the high risk factor(s) for progression to severe COVID-19 and/or hospitalization as defined in EUA.  Specific high risk criteria : Older age (>/= 73 yo); CAD; HTN; BMI; CHF   I have spoken and communicated the following to the patient or parent/caregiver regarding COVID monoclonal antibody treatment:  1. FDA has authorized the emergency use for the treatment of mild to moderate COVID-19 in adults and pediatric patients with positive results of direct SARS-CoV-2 viral testing who are 65 years of age and older weighing at least 40 kg, and who are at high risk for progressing to severe COVID-19 and/or hospitalization.  2. The significant known and potential risks and benefits of COVID monoclonal antibody, and the extent to which such potential risks and benefits are unknown.  3. Information on available alternative treatments and the risks and benefits of those alternatives, including clinical trials.  4. Patients treated with COVID monoclonal antibody should continue to self-isolate and use infection control measures (e.g., wear mask, isolate, social distance, avoid sharing personal items, clean and disinfect "high touch" surfaces, and frequent handwashing) according to CDC guidelines.   5. The patient or parent/caregiver has the option to accept or refuse COVID monoclonal antibody treatment.  After reviewing this information with the patient, the patient has agreed to receive one of the  available covid 19 monoclonal antibodies and will be provided an appropriate fact sheet prior to infusion.  Nicolasa Ducking, NP 05/22/2020 10:48 AM

## 2020-08-20 ENCOUNTER — Encounter (HOSPITAL_COMMUNITY): Payer: Medicare Other | Admitting: Cardiology

## 2020-08-20 ENCOUNTER — Other Ambulatory Visit (HOSPITAL_COMMUNITY): Payer: Medicare Other

## 2020-11-01 ENCOUNTER — Encounter (HOSPITAL_COMMUNITY): Payer: Medicare Other | Admitting: Cardiology

## 2020-11-01 ENCOUNTER — Other Ambulatory Visit (HOSPITAL_COMMUNITY): Payer: Medicare Other

## 2020-11-12 ENCOUNTER — Encounter (HOSPITAL_COMMUNITY): Payer: Self-pay | Admitting: Cardiology

## 2020-11-12 ENCOUNTER — Ambulatory Visit (HOSPITAL_BASED_OUTPATIENT_CLINIC_OR_DEPARTMENT_OTHER)
Admission: RE | Admit: 2020-11-12 | Discharge: 2020-11-12 | Disposition: A | Discharge status: Home / Self Care | Payer: Medicare Other | Source: Ambulatory Visit | Attending: Cardiology | Admitting: Cardiology

## 2020-11-12 ENCOUNTER — Ambulatory Visit (HOSPITAL_COMMUNITY)
Admission: RE | Admit: 2020-11-12 | Discharge: 2020-11-12 | Disposition: A | Discharge status: Home / Self Care | Payer: Medicare Other | Source: Ambulatory Visit | Attending: Cardiology | Admitting: Cardiology

## 2020-11-12 ENCOUNTER — Other Ambulatory Visit: Payer: Self-pay

## 2020-11-12 VITALS — BP 138/80 | HR 60 | Wt 217.4 lb

## 2020-11-12 DIAGNOSIS — Z8249 Family history of ischemic heart disease and other diseases of the circulatory system: Secondary | ICD-10-CM | POA: Insufficient documentation

## 2020-11-12 DIAGNOSIS — I110 Hypertensive heart disease with heart failure: Secondary | ICD-10-CM | POA: Diagnosis not present

## 2020-11-12 DIAGNOSIS — Z955 Presence of coronary angioplasty implant and graft: Secondary | ICD-10-CM | POA: Insufficient documentation

## 2020-11-12 DIAGNOSIS — I2510 Atherosclerotic heart disease of native coronary artery without angina pectoris: Secondary | ICD-10-CM | POA: Diagnosis not present

## 2020-11-12 DIAGNOSIS — Z79899 Other long term (current) drug therapy: Secondary | ICD-10-CM | POA: Diagnosis not present

## 2020-11-12 DIAGNOSIS — I358 Other nonrheumatic aortic valve disorders: Secondary | ICD-10-CM | POA: Insufficient documentation

## 2020-11-12 DIAGNOSIS — Z7902 Long term (current) use of antithrombotics/antiplatelets: Secondary | ICD-10-CM | POA: Insufficient documentation

## 2020-11-12 DIAGNOSIS — I255 Ischemic cardiomyopathy: Secondary | ICD-10-CM | POA: Insufficient documentation

## 2020-11-12 DIAGNOSIS — E785 Hyperlipidemia, unspecified: Secondary | ICD-10-CM | POA: Insufficient documentation

## 2020-11-12 DIAGNOSIS — I5022 Chronic systolic (congestive) heart failure: Secondary | ICD-10-CM

## 2020-11-12 DIAGNOSIS — I252 Old myocardial infarction: Secondary | ICD-10-CM | POA: Insufficient documentation

## 2020-11-12 DIAGNOSIS — Z886 Allergy status to analgesic agent status: Secondary | ICD-10-CM | POA: Diagnosis not present

## 2020-11-12 HISTORY — DX: Heart failure, unspecified: I50.9

## 2020-11-12 LAB — CBC
HCT: 46.7 % (ref 39.0–52.0)
Hemoglobin: 16 g/dL (ref 13.0–17.0)
MCH: 30.6 pg (ref 26.0–34.0)
MCHC: 34.3 g/dL (ref 30.0–36.0)
MCV: 89.3 fL (ref 80.0–100.0)
Platelets: 199 10*3/uL (ref 150–400)
RBC: 5.23 MIL/uL (ref 4.22–5.81)
RDW: 12.2 % (ref 11.5–15.5)
WBC: 6 10*3/uL (ref 4.0–10.5)
nRBC: 0 % (ref 0.0–0.2)

## 2020-11-12 LAB — ECHOCARDIOGRAM COMPLETE
Area-P 1/2: 2.37 cm2
Calc EF: 53.3 %
S' Lateral: 3 cm
Single Plane A2C EF: 56.5 %
Single Plane A4C EF: 49.4 %

## 2020-11-12 LAB — LIPID PANEL
Cholesterol: 150 mg/dL (ref 0–200)
HDL: 27 mg/dL — ABNORMAL LOW (ref >40)
LDL Cholesterol: 87 mg/dL (ref 0–99)
Total CHOL/HDL Ratio: 5.6 RATIO
Triglycerides: 178 mg/dL — ABNORMAL HIGH (ref <150)
VLDL: 36 mg/dL (ref 0–40)

## 2020-11-12 LAB — BASIC METABOLIC PANEL
Anion gap: 5 (ref 5–15)
BUN: 10 mg/dL (ref 8–23)
CO2: 29 mmol/L (ref 22–32)
Calcium: 8.9 mg/dL (ref 8.9–10.3)
Chloride: 102 mmol/L (ref 98–111)
Creatinine, Ser: 0.99 mg/dL (ref 0.61–1.24)
GFR, Estimated: >60 mL/min (ref >60)
Glucose, Bld: 181 mg/dL — ABNORMAL HIGH (ref 70–99)
Potassium: 4.3 mmol/L (ref 3.5–5.1)
Sodium: 136 mmol/L (ref 135–145)

## 2020-11-12 MED ORDER — EPLERENONE 25 MG PO TABS: 50.0000 mg | ORAL_TABLET | Freq: Every day | ORAL | 3 refills | Status: DC

## 2020-11-12 NOTE — Progress Notes (Signed)
  Echocardiogram 2D Echocardiogram has been performed.  Janalyn Harder 11/12/2020, 9:59 AM

## 2020-11-12 NOTE — Patient Instructions (Signed)
Increase Eplerenone to 50 mg Daily  Labs done today, your results will be available in MyChart, we will contact you for abnormal readings.  Your physician recommends that you return for lab work in: 10-14 days  Please call our office in September to schedule your follow up appointment  If you have any questions or concerns before your next appointment please send [place] a message through [place] or call our office at [phone].    TO LEAVE A MESSAGE FOR THE NURSE SELECT OPTION 2, PLEASE LEAVE A MESSAGE INCLUDING: . YOUR NAME . DATE OF BIRTH . CALL BACK NUMBER . REASON FOR CALL**this is important as we prioritize the call backs  YOU WILL RECEIVE A CALL BACK THE SAME DAY AS LONG AS YOU CALL BEFORE 4:00 PM  At the [facility], you and your health needs are our priority. As part of our continuing mission to provide you with exceptional heart care, we have created designated Provider Care Teams. These Care Teams include your primary Cardiologist (physician) and Advanced Practice Providers (APPs- Physician Assistants and Nurse Practitioners) who all work together to provide you with the care you need, when you need it.   You may see any of the following providers on your designated Care Team at your next follow up: Marland Kitchen Dr Arvilla Meres . Dr Marca Ancona . Dr Thornell Mule . Tonye Becket, NP . Robbie Lis, PA . Shanda Bumps [place],NP . Karle Plumber, PharmD   Please be sure to bring in all your medications bottles to every appointment.

## 2020-11-14 NOTE — Progress Notes (Signed)
ID:  [name], DOB 1945/10/02, MRN [id number]   Provider location: [facility] Type of Visit: Established patient   PCP:  Bonnielee Haff, MD  Cardiologist:  Dr. Shirlee Latch   History of Present Illness: [name] is a 75 y.o. male who has a history of CAD s/p anterolateral STEMI in 1/08 and ischemic cardiomyopathy. He had a cardiac cath with patent LAD stent in 3/11.  Echo in 12/14 showed EF 50-55% with apical septal and apical hypokinesis.    In 5/17, patient was seen by [name] for chest pain evaluation.  He was noting chest tightness when carrying a heavy load or exerting himself moderately to heavily.  In fact, he ended up retiring due to the exertional chest pain.  He had generalized fatigue that was new.    Echo was done in 5/17,  showing EF down to 35-40%. Cardiolite was done, showing EF 36% with large apical infarction.  LHC was done, showing patent LAD stent and 70% stenosis in a small branch off OM1, medical management.   Echo in 7/20 showed EF 40-45% with mild LVH and mild LV dilation, normal RV systolic function.  Echo was done today and reviewed, EF 45-50% with apical akinesis, no LV thrombus, normal RV.   He returns for followup of CAD and ischemic cardiomyopathy.  SBP 130s-140s generally.  No chest pain. Works in his yard and walks on treadmill 2-3 times/week.  No dyspnea walking on flat ground, notes some dyspnea walking up hills.  Weight is down 2 lbs.      Labs (11/15): LDL 87, HDL 36, HCT 45.5, K 3.4, creatinine 1.0, LFTs normal Labs (5/17): LDL 73, HDL 28, TSH normal, K 3.4, creatinine 1.06, HCT 44.6 Labs (6/17): BNP 81, K 3.2, creatinine 1.18 Labs (7/17): K 4, creatinine 0.95 Labs (2/20): K 4, creatinine 1.07, LDL 62, HDL 26 Labs (7/20): K 4.1, creatinine 0.99 Labs (12/20): K 3.8, creatinine 1.05, LDL 59 Labs (4/21): LDL 73, HDL 30, TGs 120, hgb 15.9, K 4, creatinine 1.25, AST/ALT normal Labs (12/21): creatinine 1.05  ECG  (personally reviewed): NSR, old ASMI  PMH: 1. CAD: Anterolateral STEMI in 1/08 with BMS to LAD (had luminals in LCx and RCA). Myoview in 1/10 showed EF 45% with anteroseptal infarction and mild peri-infarction ischemia.  LHC (3/11) with patent LAD stent, EF 40-45%.  - ETT-Cardiolite (6/17) with EF 36%, 6 min exercise, hypertensive BP response, large apical infarct.   - LHC (6/17): patent LAD stent, 70% stenosis small branch off OM1.  2. Ischemic cardiomyopathy: EF 30% at time of STEMI.  Most recent study was LV-gram in 3/11 with EF 40-45%.  Echo (12/14) showed EF 50-55%, apical septal hypokinesis an apical hypokinesis, no LV thrombus.  - Echo (6/17) with EF 35-40%, septal/apical akinesis, moderate LV dilation, mild MR.  - Echo (7/20): EF 40-45% with mild LVH and mild LV dilation, normal RV systolic function. - Echo (4/22): EF 40-45% with mild LVH and mild LV dilation, normal RV systolic function.  Echo was done today and reviewed, EF 45-50% with apical akinesis, no LV thrombus, normal RV.  3. HTN 4. Hyperlipidemia 5. ABIs in 10/14 were normal. 6. Abdominal US in 10/14 showed no AAA.  7. Aspirin allergy: Questionable.  Had hives once after taking aspirin but has taken it since without problems.   8. 8/20 sleep study showed mild OSA.   SH: Married, has children, lives in [place], retired, nonsmoker.   FH: CAD  ROS: All systems reviewed and negative except as per HPI.   Current Outpatient Medications  Medication Sig Dispense Refill  . carvedilol (COREG) 25 MG tablet Take 1 tablet (25 mg total) by mouth 2 (two) times daily with a meal. 180 tablet 3  . clopidogrel (PLAVIX) 75 MG tablet Take 75 mg by mouth daily with breakfast.    . sacubitril-valsartan (ENTRESTO) 97-103 MG Take 1 tablet by mouth 2 (two) times daily. 60 tablet 3  . simvastatin (ZOCOR) 40 MG tablet Take 40 mg by mouth daily.    Marland Kitchen eplerenone (INSPRA) 25 MG tablet Take 2 tablets (50 mg total) by mouth daily. 90 tablet 3    No current facility-administered medications for this encounter.   Exam:   BP 138/80   Pulse 60   Wt 98.6 kg (217 lb 6.4 oz)   SpO2 97%   BMI 29.48 kg/m  General: NAD Neck: No JVD, no thyromegaly or thyroid nodule.  Lungs: Clear to auscultation bilaterally with normal respiratory effort. CV: Nondisplaced PMI.  Heart regular S1/S2, no S3/S4, no murmur.  No peripheral edema.  No carotid bruit.  Normal pedal pulses.  Abdomen: Soft, nontender, no hepatosplenomegaly, no distention.  Skin: Intact without lesions or rashes.  Neurologic: Alert and oriented x 3.  Psych: Normal affect. Extremities: No clubbing or cyanosis.  HEENT: Normal.   Assessment/Plan: 1. CAD: LAD stent was patent on cath in 3/11.  He has not been on aspirin because of a questionable aspirin allergy (had hives with aspirin once but has taken aspirin since without problems).  Last cath in 6/17 showed patent LAD stent and 70% stenosis in branch off OM1.  Medical management. No chest pain.  - He will continue Plavix, statin, Coreg.  2. Hyperlipidemia: He is on simvastatin.  - Check lipids today.  3. Chronic systolic CHF: Ischemic cardiomyopathy.  EF 35-40% on 6/17 echo.  Echo in 7/20 showed EF 40-45%.  Echo today with EF up to 45-50%, apical akinesis.  He does not appear volume overloaded on exam. NYHA class II symptoms.  - BP still high, increase eplerenone to 50 mg daily.  BMET today and in 10 days.   - Continue Coreg 25 mg bid.   - Continue Entresto 97/103 bid.  4. HTN: BP high, increasing eplerenone as above.   Followup in 6 months  Marca Ancona 11/14/2020  [facility] [address] [facility] [place] [address] [phone] (office) [phone] (fax)

## 2020-11-15 ENCOUNTER — Other Ambulatory Visit (HOSPITAL_COMMUNITY): Payer: Self-pay

## 2020-11-15 ENCOUNTER — Telehealth (HOSPITAL_COMMUNITY): Payer: Self-pay | Admitting: Pharmacy Technician

## 2020-11-15 ENCOUNTER — Telehealth (HOSPITAL_COMMUNITY): Payer: Self-pay

## 2020-11-15 ENCOUNTER — Other Ambulatory Visit (HOSPITAL_COMMUNITY): Payer: Self-pay | Admitting: *Deleted

## 2020-11-15 DIAGNOSIS — E785 Hyperlipidemia, unspecified: Secondary | ICD-10-CM

## 2020-11-15 MED ORDER — ROSUVASTATIN CALCIUM 20 MG PO TABS: 20.0000 mg | ORAL_TABLET | Freq: Every day | ORAL | 11 refills | Status: DC

## 2020-11-15 MED ORDER — EPLERENONE 50 MG PO TABS: 50.0000 mg | ORAL_TABLET | Freq: Every day | ORAL | 3 refills | Status: DC

## 2020-11-15 NOTE — Telephone Encounter (Signed)
-----   Message from Laurey Morale, MD sent at 11/14/2020 11:10 PM EDT ----- Want to see LDL < 70, ideally < 55. Stop Zocor, start Crestor 20 mg daily with lipids/LFTs in 2 months.

## 2020-11-15 NOTE — Telephone Encounter (Signed)
I received a voicemail from the patient regarding an Eplerenone concern. His co-pay at 50mg  daily was going to be about $350. This was not something he could afford. I could not find the patient's insurance information through an eligibility search. I then [facility] the [facility], [facility] to confirm the patient's pharmacy benefits and co-pay. The pharmacy was quoting the patient the cash price, they needed insurance information from the patient.  Called and spoke with the patient. He does not have pharmacy insurance benefits. I saw a previous note that the patient is able to get some medications through the [facility].   Will get physical copy of RX printed and faxed to [place] in [place]. Will follow up to confirm receipt.

## 2020-11-15 NOTE — Telephone Encounter (Signed)
Patient advised and verbalized understanding. Med list updated to reflect changes, lab appt scheduled,lab order entered.   Meds ordered this encounter  Medications  . rosuvastatin (CRESTOR) 20 MG tablet    Sig: Take 1 tablet (20 mg total) by mouth daily.    Dispense:  30 tablet    Refill:  11    D/C simvastatin   Orders Placed This Encounter  Procedures  . Lipid panel    Standing Status:   Future    Standing Expiration Date:   11/15/2021    Order Specific Question:   Release to patient    Answer:   Immediate  . Hepatic function panel    Standing Status:   Future    Standing Expiration Date:   11/15/2021    Order Specific Question:   Release to patient    Answer:   Immediate

## 2020-11-16 NOTE — Telephone Encounter (Signed)
Pt left vm stating he cant take crestor he had it in the past and had issues with it. Pt said simvastatin is the only statin he can take.  Routed to Dr.McLean for advice

## 2020-11-16 NOTE — Telephone Encounter (Signed)
Continue Zocor but add Zetia 10 mg daily (not a statin).  Lipids/LFTs in 2 months.

## 2020-11-17 MED ORDER — EZETIMIBE 10 MG PO TABS: 10.0000 mg | ORAL_TABLET | Freq: Every day | ORAL | 3 refills | Status: AC

## 2020-11-17 MED ORDER — SIMVASTATIN 40 MG PO TABS: 40.0000 mg | ORAL_TABLET | Freq: Every day | ORAL | 3 refills | Status: DC

## 2020-11-17 NOTE — Addendum Note (Signed)
Addended by: Modesta Messing on: 11/17/2020 10:53 AM   Modules accepted: Orders

## 2020-11-17 NOTE — Telephone Encounter (Signed)
Pt aware and agreeable with plan.  

## 2020-11-18 ENCOUNTER — Telehealth (HOSPITAL_COMMUNITY): Payer: Self-pay | Admitting: Pharmacy Technician

## 2020-11-18 NOTE — Telephone Encounter (Signed)
I called the [facility] pharmacy yesterday and was told that I would receive a call back from that department regarding the patient's Eplerenone RX.  I received a message from [place] at the [place] stating that Dr. Dennard Schaumann did go ahead and order the medication.  Called and left the patient message.   Archer Asa, CPhT

## 2020-11-24 ENCOUNTER — Other Ambulatory Visit: Payer: Self-pay

## 2020-11-24 ENCOUNTER — Ambulatory Visit (HOSPITAL_COMMUNITY)
Admission: RE | Admit: 2020-11-24 | Discharge: 2020-11-24 | Disposition: A | Discharge status: Home / Self Care | Payer: Medicare Other | Source: Ambulatory Visit | Attending: Internal Medicine | Admitting: Internal Medicine

## 2020-11-24 DIAGNOSIS — I5022 Chronic systolic (congestive) heart failure: Secondary | ICD-10-CM | POA: Diagnosis not present

## 2020-11-24 LAB — BASIC METABOLIC PANEL
Anion gap: 7 (ref 5–15)
BUN: 16 mg/dL (ref 8–23)
CO2: 29 mmol/L (ref 22–32)
Calcium: 8.9 mg/dL (ref 8.9–10.3)
Chloride: 102 mmol/L (ref 98–111)
Creatinine, Ser: 1.13 mg/dL (ref 0.61–1.24)
GFR, Estimated: >60 mL/min (ref >60)
Glucose, Bld: 265 mg/dL — ABNORMAL HIGH (ref 70–99)
Potassium: 4.3 mmol/L (ref 3.5–5.1)
Sodium: 138 mmol/L (ref 135–145)

## 2021-01-17 ENCOUNTER — Ambulatory Visit (HOSPITAL_COMMUNITY)
Admission: RE | Admit: 2021-01-17 | Discharge: 2021-01-17 | Disposition: A | Discharge status: Home / Self Care | Payer: Medicare Other | Source: Ambulatory Visit | Attending: Internal Medicine | Admitting: Internal Medicine

## 2021-01-17 ENCOUNTER — Other Ambulatory Visit: Payer: Self-pay

## 2021-01-17 DIAGNOSIS — E785 Hyperlipidemia, unspecified: Secondary | ICD-10-CM | POA: Diagnosis not present

## 2021-01-17 LAB — LIPID PANEL
Cholesterol: 149 mg/dL (ref 0–200)
HDL: 29 mg/dL — ABNORMAL LOW (ref >40)
LDL Cholesterol: 60 mg/dL (ref 0–99)
Total CHOL/HDL Ratio: 5.1 RATIO
Triglycerides: 299 mg/dL — ABNORMAL HIGH (ref <150)
VLDL: 60 mg/dL — ABNORMAL HIGH (ref 0–40)

## 2021-01-17 LAB — HEPATIC FUNCTION PANEL
ALT: 22 U/L (ref 0–44)
AST: 24 U/L (ref 15–41)
Albumin: 3.8 g/dL (ref 3.5–5.0)
Alkaline Phosphatase: 61 U/L (ref 38–126)
Bilirubin, Direct: 0.2 mg/dL (ref 0.0–0.2)
Indirect Bilirubin: 0.6 mg/dL (ref 0.3–0.9)
Total Bilirubin: 0.8 mg/dL (ref 0.3–1.2)
Total Protein: 6.5 g/dL (ref 6.5–8.1)

## 2021-02-21 ENCOUNTER — Other Ambulatory Visit (HOSPITAL_COMMUNITY): Payer: Self-pay | Admitting: Cardiology

## 2021-05-25 ENCOUNTER — Other Ambulatory Visit (HOSPITAL_COMMUNITY): Payer: Self-pay | Admitting: Cardiology

## 2021-08-31 ENCOUNTER — Other Ambulatory Visit (HOSPITAL_COMMUNITY): Payer: Self-pay | Admitting: Cardiology

## 2021-11-28 ENCOUNTER — Other Ambulatory Visit (HOSPITAL_COMMUNITY): Payer: Self-pay | Admitting: Cardiology

## 2022-01-20 ENCOUNTER — Other Ambulatory Visit (HOSPITAL_COMMUNITY): Payer: Self-pay | Admitting: Cardiology

## 2022-02-09 ENCOUNTER — Ambulatory Visit (HOSPITAL_COMMUNITY)
Admission: RE | Admit: 2022-02-09 | Discharge: 2022-02-09 | Disposition: A | Discharge status: Home / Self Care | Payer: Medicare Other | Source: Ambulatory Visit | Attending: Cardiology | Admitting: Cardiology

## 2022-02-09 ENCOUNTER — Encounter (HOSPITAL_COMMUNITY): Payer: Self-pay | Admitting: Cardiology

## 2022-02-09 VITALS — BP 144/80 | HR 67 | Wt 195.8 lb

## 2022-02-09 DIAGNOSIS — Z79899 Other long term (current) drug therapy: Secondary | ICD-10-CM | POA: Diagnosis not present

## 2022-02-09 DIAGNOSIS — I252 Old myocardial infarction: Secondary | ICD-10-CM | POA: Insufficient documentation

## 2022-02-09 DIAGNOSIS — I5022 Chronic systolic (congestive) heart failure: Secondary | ICD-10-CM | POA: Diagnosis not present

## 2022-02-09 DIAGNOSIS — Z7902 Long term (current) use of antithrombotics/antiplatelets: Secondary | ICD-10-CM | POA: Insufficient documentation

## 2022-02-09 DIAGNOSIS — Z7984 Long term (current) use of oral hypoglycemic drugs: Secondary | ICD-10-CM | POA: Insufficient documentation

## 2022-02-09 DIAGNOSIS — I110 Hypertensive heart disease with heart failure: Secondary | ICD-10-CM | POA: Insufficient documentation

## 2022-02-09 DIAGNOSIS — E785 Hyperlipidemia, unspecified: Secondary | ICD-10-CM | POA: Insufficient documentation

## 2022-02-09 DIAGNOSIS — I2510 Atherosclerotic heart disease of native coronary artery without angina pectoris: Secondary | ICD-10-CM | POA: Insufficient documentation

## 2022-02-09 DIAGNOSIS — Z955 Presence of coronary angioplasty implant and graft: Secondary | ICD-10-CM | POA: Insufficient documentation

## 2022-02-09 DIAGNOSIS — I255 Ischemic cardiomyopathy: Secondary | ICD-10-CM | POA: Diagnosis not present

## 2022-02-09 LAB — BASIC METABOLIC PANEL
Anion gap: 5 (ref 5–15)
BUN: 19 mg/dL (ref 8–23)
CO2: 25 mmol/L (ref 22–32)
Calcium: 8.7 mg/dL — ABNORMAL LOW (ref 8.9–10.3)
Chloride: 110 mmol/L (ref 98–111)
Creatinine, Ser: 0.97 mg/dL (ref 0.61–1.24)
GFR, Estimated: >60 mL/min (ref >60)
Glucose, Bld: 121 mg/dL — ABNORMAL HIGH (ref 70–99)
Potassium: 3.8 mmol/L (ref 3.5–5.1)
Sodium: 140 mmol/L (ref 135–145)

## 2022-02-09 LAB — LIPID PANEL
Cholesterol: 112 mg/dL (ref 0–200)
HDL: 31 mg/dL — ABNORMAL LOW (ref >40)
LDL Cholesterol: 64 mg/dL (ref 0–99)
Total CHOL/HDL Ratio: 3.6 RATIO
Triglycerides: 85 mg/dL (ref <150)
VLDL: 17 mg/dL (ref 0–40)

## 2022-02-09 NOTE — Patient Instructions (Signed)
Medication Changes:  None, continue current medications  Lab Work:  Labs done today, your results will be available in MyChart, we will contact you for abnormal readings.  Testing/Procedures:  Your physician has requested that you have an echocardiogram. Echocardiography is a painless test that uses sound waves to create images of your heart. It provides your doctor with information about the size and shape of your heart and how well your heart's chambers and valves are working. This procedure takes approximately one hour. There are no restrictions for this procedure.  Referrals:  None  Special Instructions // Education:  Do the following things EVERYDAY: Weigh yourself in the morning before breakfast. Write it down and keep it in a log. Take your medicines as prescribed Eat low salt foods--Limit salt (sodium) to 2000 mg per day.  Stay as active as you can everyday Limit all fluids for the day to less than 2 liters   Follow-Up in: 1 year (July 2024), **PLEASE CALL OUR OFFICE IN MAY 2024 TO SCHEDULE THIS APPOINTMENT  At the [facility], you and your health needs are our priority. We have a designated team specialized in the treatment of Heart Failure. This Care Team includes your primary Heart Failure Specialized Cardiologist (physician), Advanced Practice Providers (APPs- Physician Assistants and Nurse Practitioners), and Pharmacist who all work together to provide you with the care you need, when you need it.   You may see any of the following providers on your designated Care Team at your next follow up:  Dr Arvilla Meres Dr Carron Curie, NP Robbie Lis, [place] [facility] [place], [place] Karle Plumber, PharmD   Please be sure to bring in all your medications bottles to every appointment.   Need to Contact us:  If you have any questions or concerns before your next appointment please send [place] a message through [place] or call our  office at [phone].    TO LEAVE A MESSAGE FOR THE NURSE SELECT OPTION 2, PLEASE LEAVE A MESSAGE INCLUDING: YOUR NAME DATE OF BIRTH CALL BACK NUMBER REASON FOR CALL**this is important as we prioritize the call backs  YOU WILL RECEIVE A CALL BACK THE SAME DAY AS LONG AS YOU CALL BEFORE 4:00 PM

## 2022-02-10 NOTE — Progress Notes (Signed)
ID:  [name], DOB 30-Mar-1946, MRN [id number]   Provider location:  [facility] Type of Visit: Established patient   PCP:  Bonnielee Haff, MD  Cardiologist:  Dr. Shirlee Latch   History of Present Illness: [name] is a 76 y.o. male who has a history of CAD s/p anterolateral STEMI in 1/08 and ischemic cardiomyopathy. He had a cardiac cath with patent LAD stent in 3/11.  Echo in 12/14 showed EF 50-55% with apical septal and apical hypokinesis.    In 5/17, patient was seen by [name] for chest pain evaluation.  He was noting chest tightness when carrying a heavy load or exerting himself moderately to heavily.  In fact, he ended up retiring due to the exertional chest pain.  He had generalized fatigue that was new.    Echo was done in 5/17,  showing EF down to 35-40%. Cardiolite was done, showing EF 36% with large apical infarction.  LHC was done, showing patent LAD stent and 70% stenosis in a small branch off OM1, medical management.   Echo in 7/20 showed EF 40-45% with mild LVH and mild LV dilation, normal RV systolic function.  Echo in 4/22 showed EF 45-50% with apical akinesis, no LV thrombus, normal RV.   He returns for followup of CAD and ischemic cardiomyopathy.  SBP around 130 at home though mildly elevated here today.  He is walking a lot for exercise and has cut back on carbohydrates, weight is down 22 lbs since last appointment.  No exertional chest pain or dyspnea. No lightheadedness or palpitations.     Labs (11/15): LDL 87, HDL 36, HCT 45.5, K 3.4, creatinine 1.0, LFTs normal Labs (5/17): LDL 73, HDL 28, TSH normal, K 3.4, creatinine 1.06, HCT 44.6 Labs (6/17): BNP 81, K 3.2, creatinine 1.18 Labs (7/17): K 4, creatinine 0.95 Labs (2/20): K 4, creatinine 1.07, LDL 62, HDL 26 Labs (7/20): K 4.1, creatinine 0.99 Labs (12/20): K 3.8, creatinine 1.05, LDL 59 Labs (4/21): LDL 73, HDL 30, TGs 120, hgb 15.9, K 4, creatinine 1.25, AST/ALT normal Labs  (12/21): creatinine 1.05 Labs (6/22): LDL 60, TGs 299 Labs (7/22): Creatinine 1.13  ECG (personally reviewed): NSR, septal Qs  PMH: 1. CAD: Anterolateral STEMI in 1/08 with BMS to LAD (had luminals in LCx and RCA). Myoview in 1/10 showed EF 45% with anteroseptal infarction and mild peri-infarction ischemia.  LHC (3/11) with patent LAD stent, EF 40-45%.  - ETT-Cardiolite (6/17) with EF 36%, 6 min exercise, hypertensive BP response, large apical infarct.   - LHC (6/17): patent LAD stent, 70% stenosis small branch off OM1.  2. Ischemic cardiomyopathy: EF 30% at time of STEMI.  Most recent study was LV-gram in 3/11 with EF 40-45%.  Echo (12/14) showed EF 50-55%, apical septal hypokinesis an apical hypokinesis, no LV thrombus.  - Echo (6/17) with EF 35-40%, septal/apical akinesis, moderate LV dilation, mild MR.  - Echo (7/20): EF 40-45% with mild LVH and mild LV dilation, normal RV systolic function. - Echo (4/22): EF 40-45% with mild LVH and mild LV dilation, normal RV systolic function.  Echo was done today and reviewed, EF 45-50% with apical akinesis, no LV thrombus, normal RV.  3. HTN 4. Hyperlipidemia 5. ABIs in 10/14 were normal. 6. Abdominal US in 10/14 showed no AAA.  7. Aspirin allergy: Questionable.  Had hives once after taking aspirin but has taken it since without problems.   8. 8/20 sleep study showed mild OSA.  SH: Married, has children, lives in [place], retired, nonsmoker.   FH: CAD  ROS: All systems reviewed and negative except as per HPI.   Current Outpatient Medications  Medication Sig Dispense Refill   carvedilol (COREG) 25 MG tablet Take 1 tablet (25 mg total) by mouth 2 (two) times daily with a meal. 180 tablet 0   clopidogrel (PLAVIX) 75 MG tablet Take 75 mg by mouth daily with breakfast.     eplerenone (INSPRA) 50 MG tablet Take 1 tablet (50 mg total) by mouth daily. 90 tablet 3   ezetimibe (ZETIA) 10 MG tablet Take 1 tablet (10 mg total) by mouth daily. 30  tablet 3   sacubitril-valsartan (ENTRESTO) 97-103 MG Take 1 tablet by mouth 2 (two) times daily. 60 tablet 3   simvastatin (ZOCOR) 40 MG tablet Take 1 tablet (40 mg total) by mouth at bedtime. 30 tablet 3   No current facility-administered medications for this encounter.   Exam:   BP (!) 144/80   Pulse 67   Wt 88.8 kg (195 lb 12.8 oz)   SpO2 97%   BMI 26.56 kg/m  General: NAD Neck: No JVD, no thyromegaly or thyroid nodule.  Lungs: Clear to auscultation bilaterally with normal respiratory effort. CV: Nondisplaced PMI.  Heart regular S1/S2, no S3/S4, no murmur.  No peripheral edema.  No carotid bruit.  Normal pedal pulses.  Abdomen: Soft, nontender, no hepatosplenomegaly, no distention.  Skin: Intact without lesions or rashes.  Neurologic: Alert and oriented x 3.  Psych: Normal affect. Extremities: No clubbing or cyanosis.  HEENT: Normal.   Assessment/Plan: 1. CAD: LAD stent was patent on cath in 3/11.  He has not been on aspirin because of a questionable aspirin allergy (had hives with aspirin once but has taken aspirin since without problems).  Last cath in 6/17 showed patent LAD stent and 70% stenosis in branch off OM1.  Medical management. No chest pain.  - He will continue Plavix, statin, Coreg.  2. Hyperlipidemia: He is on simvastatin + Zetia.  - Check lipids today.  3. Chronic systolic CHF: Ischemic cardiomyopathy.  EF 35-40% on 6/17 echo.  Echo in 7/20 showed EF 40-45%.  Echo in 4/22 showed EF up to 45-50%, apical akinesis.  He does not appear volume overloaded on exam. NYHA class II symptoms.    - Continue eplerenone 50 mg daily, check BMET today.    - Continue Coreg 25 mg bid.   - Continue Entresto 97/103 bid.  - Continue Jardiance 10 mg daily.  - I will arrange for echocardiogram.  4. HTN: BP seems controlled at home.   Followup in 1 year if echo is stable.   Marca Ancona 02/10/2022  [facility] [address] [facility] [place] [address] [phone] (office) [phone] (fax)

## 2022-02-23 ENCOUNTER — Ambulatory Visit (HOSPITAL_COMMUNITY)
Admission: RE | Admit: 2022-02-23 | Discharge: 2022-02-23 | Disposition: A | Discharge status: Home / Self Care | Payer: Medicare Other | Source: Ambulatory Visit | Attending: Family Medicine | Admitting: Family Medicine

## 2022-02-23 DIAGNOSIS — E785 Hyperlipidemia, unspecified: Secondary | ICD-10-CM | POA: Insufficient documentation

## 2022-02-23 DIAGNOSIS — I255 Ischemic cardiomyopathy: Secondary | ICD-10-CM | POA: Insufficient documentation

## 2022-02-23 DIAGNOSIS — I2510 Atherosclerotic heart disease of native coronary artery without angina pectoris: Secondary | ICD-10-CM | POA: Diagnosis not present

## 2022-02-23 DIAGNOSIS — I110 Hypertensive heart disease with heart failure: Secondary | ICD-10-CM | POA: Diagnosis not present

## 2022-02-23 DIAGNOSIS — I509 Heart failure, unspecified: Secondary | ICD-10-CM | POA: Insufficient documentation

## 2022-02-23 DIAGNOSIS — I5022 Chronic systolic (congestive) heart failure: Secondary | ICD-10-CM

## 2022-02-23 LAB — ECHOCARDIOGRAM COMPLETE
AR max vel: 4.37 cm2
AV Area VTI: 4.37 cm2
AV Area mean vel: 4.15 cm2
AV Mean grad: 1 mmHg
AV Peak grad: 2.1 mmHg
Ao pk vel: 0.72 m/s
Area-P 1/2: 3.5 cm2
P 1/2 time: 1274 msec
S' Lateral: 2.9 cm

## 2022-02-23 MED ORDER — PERFLUTREN LIPID MICROSPHERE
1.0000 mL | INTRAVENOUS | Status: AC | PRN
  Administered 2022-02-23: 3 mL via INTRAVENOUS

## 2022-02-23 NOTE — Progress Notes (Signed)
*  PRELIMINARY RESULTS* Echocardiogram 2D Echocardiogram has been performed.  [name] 02/23/2022, 10:10 AM

## 2022-05-05 ENCOUNTER — Other Ambulatory Visit (HOSPITAL_COMMUNITY): Payer: Self-pay | Admitting: Cardiology

## 2023-02-18 ENCOUNTER — Ambulatory Visit
Admission: EM | Admit: 2023-02-18 | Discharge: 2023-02-18 | Disposition: A | Discharge status: Home / Self Care | Payer: Medicare Other

## 2023-02-18 DIAGNOSIS — I10 Essential (primary) hypertension: Secondary | ICD-10-CM | POA: Insufficient documentation

## 2023-02-18 DIAGNOSIS — I219 Acute myocardial infarction, unspecified: Secondary | ICD-10-CM | POA: Insufficient documentation

## 2023-02-18 DIAGNOSIS — Z7729 Contact with and (suspected ) exposure to other hazardous substances: Secondary | ICD-10-CM | POA: Insufficient documentation

## 2023-02-18 DIAGNOSIS — E876 Hypokalemia: Secondary | ICD-10-CM | POA: Insufficient documentation

## 2023-02-18 DIAGNOSIS — F4310 Post-traumatic stress disorder, unspecified: Secondary | ICD-10-CM | POA: Insufficient documentation

## 2023-02-18 DIAGNOSIS — L309 Dermatitis, unspecified: Secondary | ICD-10-CM | POA: Diagnosis not present

## 2023-02-18 DIAGNOSIS — Z9189 Other specified personal risk factors, not elsewhere classified: Secondary | ICD-10-CM | POA: Insufficient documentation

## 2023-02-18 DIAGNOSIS — I509 Heart failure, unspecified: Secondary | ICD-10-CM | POA: Insufficient documentation

## 2023-02-18 DIAGNOSIS — I2510 Atherosclerotic heart disease of native coronary artery without angina pectoris: Secondary | ICD-10-CM | POA: Insufficient documentation

## 2023-02-18 DIAGNOSIS — Z7189 Other specified counseling: Secondary | ICD-10-CM | POA: Insufficient documentation

## 2023-02-18 DIAGNOSIS — G4700 Insomnia, unspecified: Secondary | ICD-10-CM | POA: Insufficient documentation

## 2023-02-18 DIAGNOSIS — H53032 Strabismic amblyopia, left eye: Secondary | ICD-10-CM | POA: Insufficient documentation

## 2023-02-18 DIAGNOSIS — F319 Bipolar disorder, unspecified: Secondary | ICD-10-CM | POA: Insufficient documentation

## 2023-02-18 DIAGNOSIS — K219 Gastro-esophageal reflux disease without esophagitis: Secondary | ICD-10-CM | POA: Insufficient documentation

## 2023-02-18 DIAGNOSIS — M25579 Pain in unspecified ankle and joints of unspecified foot: Secondary | ICD-10-CM | POA: Insufficient documentation

## 2023-02-18 DIAGNOSIS — H02423 Myogenic ptosis of bilateral eyelids: Secondary | ICD-10-CM | POA: Insufficient documentation

## 2023-02-18 DIAGNOSIS — F419 Anxiety disorder, unspecified: Secondary | ICD-10-CM | POA: Insufficient documentation

## 2023-02-18 DIAGNOSIS — L409 Psoriasis, unspecified: Secondary | ICD-10-CM | POA: Insufficient documentation

## 2023-02-18 DIAGNOSIS — E119 Type 2 diabetes mellitus without complications: Secondary | ICD-10-CM | POA: Insufficient documentation

## 2023-02-18 DIAGNOSIS — Z719 Counseling, unspecified: Secondary | ICD-10-CM | POA: Insufficient documentation

## 2023-02-18 MED ORDER — METHYLPREDNISOLONE SODIUM SUCC 125 MG IJ SOLR: 80.0000 mg | Freq: Once | INTRAMUSCULAR | Status: DC

## 2023-02-18 MED ORDER — DEXAMETHASONE SODIUM PHOSPHATE 10 MG/ML IJ SOLN
10.0000 mg | Freq: Once | INTRAMUSCULAR | Status: AC
  Administered 2023-02-18: 10 mg via INTRAMUSCULAR

## 2023-02-18 NOTE — ED Provider Notes (Addendum)
[facility]    CSN: [id number] Arrival date & time: 02/18/23  0856      History   Chief Complaint Chief Complaint  Patient presents with   Rash    HPI [name] is a 77 y.o. male.   Patient here today for suspected poison ivy rash to his left hand and around his left eye.  He states this happens about every year and he requires injection of steroids to clear.  He has been trying a topical cream without resolution.  He notes that rash is itchy.  He denies any shortness of breath, trouble swallowing, or other symptoms.  Blood pressure is elevated in office today, this is a known problem for patient and states he has not taken his medication this morning as he has not eaten.  He denies any chest pain, shortness of breath, headaches or lightheadedness.  The history is provided by the patient.    Past Medical History:  Diagnosis Date   CAD (coronary artery disease)    CHF (congestive heart failure) (HCC)    COVID-19 virus infection 04/2020   History of ST elevation myocardial infarction (STEMI)    ACUTE ANTERIOR LATERAL WITH DEVELOPING Q WAVES   HLD (hyperlipidemia)    HTN (hypertension)    Ischemic cardiomyopathy     Patient Active Problem List   Diagnosis Date Noted   CAD (coronary artery disease) 02/18/2023   Anxiety 02/18/2023   Bipolar I disorder (HCC) 02/18/2023   Type 2 diabetes mellitus (HCC) 02/18/2023   Exposure to potentially hazardous substance 02/18/2023   GERD (gastroesophageal reflux disease) 02/18/2023   Myocardial infarct (HCC) 02/18/2023   Hypertension 02/18/2023   Insomnia 02/18/2023   Personal history presenting hazards to health 02/18/2023   Posttraumatic stress disorder 02/18/2023   Psoriasis 02/18/2023   Congestive heart failure (HCC) 02/18/2023   Hypokalemia 02/18/2023   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   Other specified counseling 02/18/2023   Counseling, unspecified 02/18/2023   Pain in joint, ankle and foot  02/18/2023   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   Type 2 diabetes mellitus without complications (HCC) 02/18/2023   Essential (primary) hypertension 02/18/2023   HTN (hypertension)    Ischemic cardiomyopathy    Chronic systolic CHF (congestive heart failure) (HCC) 01/25/2016   Palpitations 12/28/2015   CHEST PAIN, PRECORDIAL 09/30/2009   Hyperlipidemia 05/31/2009   HYPERTENSION, BENIGN 05/31/2009   Coronary atherosclerosis 05/31/2009    Past Surgical History:  Procedure Laterality Date   LEFT HEART CATH Left 2008   with bare metal stenting        Home Medications    Prior to Admission medications   Medication Sig Start Date End Date Taking? Authorizing Provider  carvedilol (COREG) 25 MG tablet TAKE 1 TABLET BY MOUTH TWICE DAILY WITH A MEAL 05/05/22  Yes Laurey Morale, MD  clopidogrel (PLAVIX) 75 MG tablet Take 75 mg by mouth daily with breakfast.   Yes [provider]  empagliflozin (JARDIANCE) 25 MG TABS tablet Take 25 mg by mouth as directed. 05/15/22  Yes [provider]  eplerenone (INSPRA) 50 MG tablet Take 1 tablet (50 mg total) by mouth daily. 11/15/20  Yes Laurey Morale, MD  sacubitril-valsartan (ENTRESTO) 97-103 MG Take 1 tablet by mouth 2 (two) times daily. 08/23/17  Yes Graciella Freer, PA-C  simvastatin (ZOCOR) 40 MG tablet Take 1 tablet (40 mg total) by mouth at bedtime. 11/17/20 02/18/23 Yes Laurey Morale, MD  ezetimibe Weldon Picking)  10 MG tablet Take 1 tablet (10 mg total) by mouth daily. 11/17/20 02/18/23  Laurey Morale, MD  OMEPRAZOLE PO Take 20 mg by mouth daily. 11/15/22   [provider]    Family History Family History  Problem Relation Age of Onset   CAD Other    Heart attack Brother    Stroke Other    Hypertension Other     Social History Social History   Tobacco Use   Smoking status: Former   Smokeless tobacco: Never  Advertising account planner   Vaping status: Never Used  Substance Use Topics   Alcohol use: No    Drug use: No     Allergies   Valproic acid, Spironolactone, and Aspirin   Review of Systems Review of Systems  Constitutional:  Negative for chills and fever.  HENT:  Negative for drooling, facial swelling and trouble swallowing.   Eyes:  Negative for discharge and redness.  Respiratory:  Negative for shortness of breath and wheezing.   Gastrointestinal:  Negative for nausea and vomiting.  Skin:  Positive for rash.     Physical Exam Triage Vital Signs ED Triage Vitals  Encounter Vitals Group     BP      Systolic BP Percentile      Diastolic BP Percentile      Pulse      Resp      Temp      Temp src      SpO2      Weight      Height      Head Circumference      Peak Flow      Pain Score      Pain Loc      Pain Education      Exclude from Growth Chart    No data found.  Updated Vital Signs BP (!) 190/81 (BP Location: Left Arm)   Pulse 63   Temp 98.1 F (36.7 C) (Oral)   Resp 18   Ht 6\' 1"  (1.854 m)   Wt 200 lb (90.7 kg)   SpO2 96%   BMI 26.39 kg/m   Physical Exam Vitals and nursing note reviewed.  Constitutional:      General: He is not in acute distress.    Appearance: Normal appearance. He is not ill-appearing.  HENT:     Head: Normocephalic and atraumatic.     Nose: Nose normal.     Mouth/Throat:     Mouth: Mucous membranes are moist.     Pharynx: Oropharynx is clear.  Eyes:     Conjunctiva/sclera: Conjunctivae normal.  Cardiovascular:     Rate and Rhythm: Normal rate.  Pulmonary:     Effort: Pulmonary effort is normal. No respiratory distress.  Skin:    General: Skin is warm and dry.     Findings: Rash (erythematous papulovesicular rash noted to left hand and below left eye, upper forehead) present.  Neurological:     Mental Status: He is alert.  Psychiatric:        Mood and Affect: Mood normal.        Thought Content: Thought content normal.      UC Treatments / Results  Labs (all labs ordered are listed, but only abnormal  results are displayed) Labs Reviewed - No data to display  EKG   Radiology No results found.  Procedures Procedures (including critical care time)  Medications Ordered in UC Medications  dexamethasone (DECADRON) injection 10 mg (10 mg Intramuscular  Given 02/18/23 0950)    Initial Impression / Assessment and Plan / UC Course  I have reviewed the triage vital signs and the nursing notes.  Pertinent labs & imaging results that were available during my care of the patient were reviewed by me and considered in my medical decision making (see chart for details).    Dexamethasone injection administered in office and recommended follow-up if no gradual improvement.  Discussed BP, recommended he continue to take medication as prescribed.   Final Clinical Impressions(s) / UC Diagnoses   Final diagnoses:  Dermatitis  Primary hypertension     Discharge Instructions      Follow up if no gradual improvement or with any further concerns.      ED Prescriptions   None    PDMP not reviewed this encounter.   Tomi Bamberger, PA-C 02/18/23 1448    Tomi Bamberger, PA-C 02/18/23 1449    Tomi Bamberger, PA-C 02/18/23 [phone]

## 2023-02-18 NOTE — ED Triage Notes (Signed)
"  I have poison ivy on my left hand around my left eye". "This happens every year and I need a shot". No other concerns.

## 2023-05-16 ENCOUNTER — Encounter (HOSPITAL_COMMUNITY): Payer: Self-pay | Admitting: Cardiology

## 2023-05-16 ENCOUNTER — Ambulatory Visit (HOSPITAL_COMMUNITY)
Admission: RE | Admit: 2023-05-16 | Discharge: 2023-05-16 | Disposition: A | Discharge status: Home / Self Care | Payer: Medicare Other | Source: Ambulatory Visit | Attending: Cardiology | Admitting: Cardiology

## 2023-05-16 VITALS — BP 144/80 | HR 63 | Wt 203.8 lb

## 2023-05-16 DIAGNOSIS — I255 Ischemic cardiomyopathy: Secondary | ICD-10-CM | POA: Diagnosis not present

## 2023-05-16 DIAGNOSIS — F32A Depression, unspecified: Secondary | ICD-10-CM | POA: Insufficient documentation

## 2023-05-16 DIAGNOSIS — Z955 Presence of coronary angioplasty implant and graft: Secondary | ICD-10-CM | POA: Insufficient documentation

## 2023-05-16 DIAGNOSIS — R0789 Other chest pain: Secondary | ICD-10-CM | POA: Insufficient documentation

## 2023-05-16 DIAGNOSIS — E785 Hyperlipidemia, unspecified: Secondary | ICD-10-CM | POA: Diagnosis not present

## 2023-05-16 DIAGNOSIS — I5022 Chronic systolic (congestive) heart failure: Secondary | ICD-10-CM | POA: Insufficient documentation

## 2023-05-16 DIAGNOSIS — Z7984 Long term (current) use of oral hypoglycemic drugs: Secondary | ICD-10-CM | POA: Insufficient documentation

## 2023-05-16 DIAGNOSIS — Z886 Allergy status to analgesic agent status: Secondary | ICD-10-CM | POA: Insufficient documentation

## 2023-05-16 DIAGNOSIS — I252 Old myocardial infarction: Secondary | ICD-10-CM | POA: Diagnosis not present

## 2023-05-16 DIAGNOSIS — Z79899 Other long term (current) drug therapy: Secondary | ICD-10-CM | POA: Insufficient documentation

## 2023-05-16 DIAGNOSIS — I110 Hypertensive heart disease with heart failure: Secondary | ICD-10-CM | POA: Diagnosis not present

## 2023-05-16 DIAGNOSIS — I2510 Atherosclerotic heart disease of native coronary artery without angina pectoris: Secondary | ICD-10-CM | POA: Insufficient documentation

## 2023-05-16 DIAGNOSIS — Z7902 Long term (current) use of antithrombotics/antiplatelets: Secondary | ICD-10-CM | POA: Insufficient documentation

## 2023-05-16 LAB — BASIC METABOLIC PANEL
Anion gap: 10 (ref 5–15)
BUN: 16 mg/dL (ref 8–23)
CO2: 27 mmol/L (ref 22–32)
Calcium: 9.3 mg/dL (ref 8.9–10.3)
Chloride: 101 mmol/L (ref 98–111)
Creatinine, Ser: 0.9 mg/dL (ref 0.61–1.24)
GFR, Estimated: >60 mL/min (ref >60)
Glucose, Bld: 168 mg/dL — ABNORMAL HIGH (ref 70–99)
Potassium: 4.3 mmol/L (ref 3.5–5.1)
Sodium: 138 mmol/L (ref 135–145)

## 2023-05-16 LAB — LIPID PANEL
Cholesterol: 143 mg/dL (ref 0–200)
HDL: 33 mg/dL — ABNORMAL LOW (ref >40)
LDL Cholesterol: 86 mg/dL (ref 0–99)
Total CHOL/HDL Ratio: 4.3 {ratio}
Triglycerides: 120 mg/dL (ref <150)
VLDL: 24 mg/dL (ref 0–40)

## 2023-05-16 MED ORDER — AMLODIPINE BESYLATE 2.5 MG PO TABS: 2.5000 mg | ORAL_TABLET | Freq: Every day | ORAL | 6 refills | Status: DC

## 2023-05-16 MED ORDER — EPLERENONE 25 MG PO TABS: 25.0000 mg | ORAL_TABLET | Freq: Every day | ORAL | 6 refills | Status: DC

## 2023-05-16 MED ORDER — SERTRALINE HCL 25 MG PO TABS: 25.0000 mg | ORAL_TABLET | Freq: Every day | ORAL | 0 refills | Status: AC

## 2023-05-16 NOTE — Progress Notes (Signed)
ID:  [name], DOB 12-28-1945, MRN [id number]   Provider location: [facility] Type of Visit: Established patient   PCP:  Kaleen Mask, MD  Cardiologist:  Dr. Shirlee Latch   History of Present Illness: [name] is a 77 y.o. male who has a history of CAD s/p anterolateral STEMI in 1/08 and ischemic cardiomyopathy. He had a cardiac cath with patent LAD stent in 3/11.  Echo in 12/14 showed EF 50-55% with apical septal and apical hypokinesis.    In 5/17, patient was seen by [name] for chest pain evaluation.  He was noting chest tightness when carrying a heavy load or exerting himself moderately to heavily.  In fact, he ended up retiring due to the exertional chest pain.  He had generalized fatigue that was new.    Echo was done in 5/17,  showing EF down to 35-40%. Cardiolite was done, showing EF 36% with large apical infarction.  LHC was done, showing patent LAD stent and 70% stenosis in a small branch off OM1, medical management.   Echo in 7/20 showed EF 40-45% with mild LVH and mild LV dilation, normal RV systolic function.  Echo in 4/22 showed EF 45-50% with apical akinesis, no LV thrombus, normal RV.  Echo in 7/23 showed EF 40-45%, septal/apical akinesis, RV normal.   He returns for followup of CAD and ischemic cardiomyopathy.  BP has been elevated, runs 130s-140s at home.  He walks for exercise several times/week.  No chest pain or exertional dyspnea.  No lightheadedness.  No palpitations.  He feels depressed and asks if he can take anything for it.    Labs (11/15): LDL 87, HDL 36, HCT 45.5, K 3.4, creatinine 1.0, LFTs normal Labs (5/17): LDL 73, HDL 28, TSH normal, K 3.4, creatinine 1.06, HCT 44.6 Labs (6/17): BNP 81, K 3.2, creatinine 1.18 Labs (7/17): K 4, creatinine 0.95 Labs (2/20): K 4, creatinine 1.07, LDL 62, HDL 26 Labs (7/20): K 4.1, creatinine 0.99 Labs (12/20): K 3.8, creatinine 1.05, LDL 59 Labs (4/21): LDL 73, HDL 30, TGs 120, hgb  15.9, K 4, creatinine 1.25, AST/ALT normal Labs (12/21): creatinine 1.05 Labs (6/22): LDL 60, TGs 299 Labs (7/22): Creatinine 1.13 Labs (7/23): LDL 64, K 3.8, creatinine 4.09  ECG (personally reviewed): NSR, old ASMI  PMH: 1. CAD: Anterolateral STEMI in 1/08 with BMS to LAD (had luminals in LCx and RCA). Myoview in 1/10 showed EF 45% with anteroseptal infarction and mild peri-infarction ischemia.  LHC (3/11) with patent LAD stent, EF 40-45%.  - ETT-Cardiolite (6/17) with EF 36%, 6 min exercise, hypertensive BP response, large apical infarct.   - LHC (6/17): patent LAD stent, 70% stenosis small branch off OM1.  2. Ischemic cardiomyopathy: EF 30% at time of STEMI.  Most recent study was LV-gram in 3/11 with EF 40-45%.  Echo (12/14) showed EF 50-55%, apical septal hypokinesis an apical hypokinesis, no LV thrombus.  - Echo (6/17) with EF 35-40%, septal/apical akinesis, moderate LV dilation, mild MR.  - Echo (7/20): EF 40-45% with mild LVH and mild LV dilation, normal RV systolic function. - Echo (4/22): EF 40-45% with mild LVH and mild LV dilation, normal RV systolic function.  Echo was done today and reviewed, EF 45-50% with apical akinesis, no LV thrombus, normal RV.  - Echo (7/23): EF 40-45%, septal/apical akinesis, RV normal. 3. HTN 4. Hyperlipidemia 5. ABIs in 10/14 were normal. 6. Abdominal US in 10/14 showed no AAA.  7. Aspirin allergy: Questionable.  Had hives once after taking aspirin but has taken it since without problems.   8. 8/20 sleep study showed mild OSA.  9. Depression  SH: Married, has children, lives in [place], retired, nonsmoker.   FH: CAD  ROS: All systems reviewed and negative except as per HPI.   Current Outpatient Medications  Medication Sig Dispense Refill   amLODipine (NORVASC) 2.5 MG tablet Take 1 tablet (2.5 mg total) by mouth daily. 30 tablet 6   carvedilol (COREG) 25 MG tablet TAKE 1 TABLET BY MOUTH TWICE DAILY WITH A MEAL 180 tablet 3    clopidogrel (PLAVIX) 75 MG tablet Take 75 mg by mouth daily with breakfast.     empagliflozin (JARDIANCE) 25 MG TABS tablet Take 25 mg by mouth daily.     ezetimibe (ZETIA) 10 MG tablet Take 1 tablet (10 mg total) by mouth daily. 30 tablet 3   OMEPRAZOLE PO Take 20 mg by mouth daily.     sacubitril-valsartan (ENTRESTO) 97-103 MG Take 1 tablet by mouth 2 (two) times daily. 60 tablet 3   sertraline (ZOLOFT) 25 MG tablet Take 1 tablet (25 mg total) by mouth daily. 30 tablet 0   simvastatin (ZOCOR) 40 MG tablet Take 1 tablet (40 mg total) by mouth at bedtime. 30 tablet 3   eplerenone (INSPRA) 25 MG tablet Take 1 tablet (25 mg total) by mouth at bedtime. 30 tablet 6   No current facility-administered medications for this encounter.   Exam:   BP (!) 144/80   Pulse 63   Wt 92.4 kg (203 lb 12.8 oz)   SpO2 96%   BMI 26.89 kg/m  General: NAD Neck: No JVD, no thyromegaly or thyroid nodule.  Lungs: Clear to auscultation bilaterally with normal respiratory effort. CV: Nondisplaced PMI.  Heart regular S1/S2, no S3/S4, no murmur.  No peripheral edema.  No carotid bruit.  Normal pedal pulses.  Abdomen: Soft, nontender, no hepatosplenomegaly, no distention.  Skin: Intact without lesions or rashes.  Neurologic: Alert and oriented x 3.  Psych: Depressed affect. Extremities: No clubbing or cyanosis.  HEENT: Normal.   Assessment/Plan: 1. CAD: LAD stent was patent on cath in 3/11.  He has not been on aspirin because of a questionable aspirin allergy (had hives with aspirin once but has taken aspirin since without problems).  Last cath in 6/17 showed patent LAD stent and 70% stenosis in branch off OM1.  Medical management. No chest pain.  - Continue Plavix.  - Continue statin.  2. Hyperlipidemia: He is on simvastatin + Zetia.  - Check lipids today, goal LDL < 55. .  3. Chronic systolic CHF: Ischemic cardiomyopathy.  EF 35-40% on 6/17 echo.  Echo in 7/20 showed EF 40-45%.  Echo in 4/22 showed EF up to  45-50%, apical akinesis. Echo in 7/23 with EF 40-45%.   He does not appear volume overloaded on exam. NYHA class I-II symptoms.    - Continue eplerenone 25 mg daily, take at bedtime.  He has a hard time tolerating eplerenone, could not take 50 mg daily.  - Continue Coreg 25 mg bid.   - Continue Entresto 97/103 bid.  - Continue Jardiance 10 mg daily.  - I will arrange for repeat echocardiogram.  4. HTN: BP elevated, start amlodipine 2.5 mg daily.  5. Depression: I will start him on low dose sertraline 25 mg daily.  I will have him followup for further management of depression with his PCP.   Followup in 6 months with APP.  Marca Ancona 05/16/2023  [facility] [address] Heart [facility] [place] [address] [phone] (office) [phone] (fax)

## 2023-05-16 NOTE — Patient Instructions (Addendum)
Medication Changes:  START Amlodipine 2.5 mg Daily  START Sertraline 25 mg Daily, further refills will need to come from your Primary Care Provider so please schedule a follow-up appointment if you do not already have one scheduled  Change Eplerenone to 25 mg Daily at Bedtime  Lab Work:  Labs done today, your results will be available in MyChart, we will contact you for abnormal readings.  Testing/Procedures:  Your physician has requested that you have an echocardiogram. Echocardiography is a painless test that uses sound waves to create images of your heart. It provides your doctor with information about the size and shape of your heart and how well your heart's chambers and valves are working. This procedure takes approximately one hour. There are no restrictions for this procedure. Please do NOT wear cologne, perfume, aftershave, or lotions (deodorant is allowed). Please arrive 15 minutes prior to your appointment time.   Referrals:  none  Special Instructions // Education:  Do the following things EVERYDAY: Weigh yourself in the morning before breakfast. Write it down and keep it in a log. Take your medicines as prescribed Eat low salt foods--Limit salt (sodium) to 2000 mg per day.  Stay as active as you can everyday Limit all fluids for the day to less than 2 liters   Follow-Up in: 6 months   At the [facility], you and your health needs are our priority. We have a designated team specialized in the treatment of Heart Failure. This Care Team includes your primary Heart Failure Specialized Cardiologist (physician), Advanced Practice Providers (APPs- Physician Assistants and Nurse Practitioners), and Pharmacist who all work together to provide you with the care you need, when you need it.   You may see any of the following providers on your designated Care Team at your next follow up:  Dr. Arvilla Meres Dr. Marca Ancona Dr. Dorthula Nettles Dr. Theresia Bough Tonye Becket, NP Robbie Lis, [place] [facility] [place], [place] Brynda Peon, NP [place] Lee, NP Karle Plumber, PharmD   Please be sure to bring in all your medications bottles to every appointment.   Need to Contact us:  If you have any questions or concerns before your next appointment please send [place] a message through [place] or call our office at [phone].    TO LEAVE A MESSAGE FOR THE NURSE SELECT OPTION 2, PLEASE LEAVE A MESSAGE INCLUDING: YOUR NAME DATE OF BIRTH CALL BACK NUMBER REASON FOR CALL**this is important as we prioritize the call backs  YOU WILL RECEIVE A CALL BACK THE SAME DAY AS LONG AS YOU CALL BEFORE 4:00 PM

## 2023-06-11 ENCOUNTER — Other Ambulatory Visit (HOSPITAL_COMMUNITY): Payer: Self-pay | Admitting: Cardiology

## 2023-07-04 ENCOUNTER — Ambulatory Visit (HOSPITAL_COMMUNITY)
Admission: RE | Admit: 2023-07-04 | Discharge: 2023-07-04 | Disposition: A | Discharge status: Home / Self Care | Payer: Medicare Other | Source: Ambulatory Visit | Attending: Cardiology | Admitting: Cardiology

## 2023-07-04 DIAGNOSIS — E119 Type 2 diabetes mellitus without complications: Secondary | ICD-10-CM | POA: Diagnosis not present

## 2023-07-04 DIAGNOSIS — I5022 Chronic systolic (congestive) heart failure: Secondary | ICD-10-CM | POA: Insufficient documentation

## 2023-07-04 DIAGNOSIS — I219 Acute myocardial infarction, unspecified: Secondary | ICD-10-CM | POA: Diagnosis not present

## 2023-07-04 DIAGNOSIS — E785 Hyperlipidemia, unspecified: Secondary | ICD-10-CM | POA: Diagnosis not present

## 2023-07-04 DIAGNOSIS — I2510 Atherosclerotic heart disease of native coronary artery without angina pectoris: Secondary | ICD-10-CM | POA: Diagnosis not present

## 2023-07-04 DIAGNOSIS — I110 Hypertensive heart disease with heart failure: Secondary | ICD-10-CM | POA: Insufficient documentation

## 2023-07-04 LAB — ECHOCARDIOGRAM COMPLETE
AR max vel: 3.55 cm2
AV Area VTI: 4.22 cm2
AV Area mean vel: 3.89 cm2
AV Mean grad: 1 mm[Hg]
AV Peak grad: 2.7 mm[Hg]
Ao pk vel: 0.82 m/s
Area-P 1/2: 3.72 cm2
Calc EF: 59.7 %
MV VTI: 4.13 cm2
S' Lateral: 2.9 cm
Single Plane A2C EF: 61.2 %
Single Plane A4C EF: 53.7 %

## 2023-07-04 NOTE — Progress Notes (Signed)
  Echocardiogram 2D Echocardiogram has been performed.  Ocie Doyne RDCS 07/04/2023, 10:40 AM

## 2023-07-05 ENCOUNTER — Telehealth (HOSPITAL_COMMUNITY): Payer: Self-pay

## 2023-07-05 NOTE — Telephone Encounter (Addendum)
Pt aware, agreeable, and verbalized understanding   ----- Message from Marca Ancona sent at 07/04/2023  5:26 PM EST ----- EF 45-50%, mildly improved.

## 2023-11-13 ENCOUNTER — Telehealth (HOSPITAL_COMMUNITY): Payer: Self-pay | Admitting: *Deleted

## 2023-11-13 NOTE — Telephone Encounter (Signed)
 Called to confirm/remind patient of their appointment at the [facility] on 11/02/23***.   Appointment:   [x] Confirmed  [] Left mess   [] No answer/No voice mail  [] Phone not in service  Patient reminded to bring all medications and/or complete list.  Confirmed patient has transportation. Gave directions, instructed to utilize valet parking.

## 2023-11-14 ENCOUNTER — Ambulatory Visit (HOSPITAL_COMMUNITY)
Admission: RE | Admit: 2023-11-14 | Discharge: 2023-11-14 | Disposition: A | Discharge status: Home / Self Care | Payer: Medicare Other | Source: Ambulatory Visit | Attending: Cardiology | Admitting: Cardiology

## 2023-11-14 ENCOUNTER — Telehealth (HOSPITAL_COMMUNITY): Payer: Self-pay

## 2023-11-14 ENCOUNTER — Encounter (HOSPITAL_COMMUNITY): Payer: Self-pay

## 2023-11-14 VITALS — BP 130/76 | HR 67 | Wt 206.2 lb

## 2023-11-14 DIAGNOSIS — I2510 Atherosclerotic heart disease of native coronary artery without angina pectoris: Secondary | ICD-10-CM | POA: Insufficient documentation

## 2023-11-14 DIAGNOSIS — Z955 Presence of coronary angioplasty implant and graft: Secondary | ICD-10-CM | POA: Diagnosis not present

## 2023-11-14 DIAGNOSIS — G4733 Obstructive sleep apnea (adult) (pediatric): Secondary | ICD-10-CM | POA: Diagnosis not present

## 2023-11-14 DIAGNOSIS — Z79899 Other long term (current) drug therapy: Secondary | ICD-10-CM | POA: Diagnosis not present

## 2023-11-14 DIAGNOSIS — I255 Ischemic cardiomyopathy: Secondary | ICD-10-CM | POA: Diagnosis not present

## 2023-11-14 DIAGNOSIS — Z7984 Long term (current) use of oral hypoglycemic drugs: Secondary | ICD-10-CM | POA: Diagnosis not present

## 2023-11-14 DIAGNOSIS — I5022 Chronic systolic (congestive) heart failure: Secondary | ICD-10-CM | POA: Diagnosis present

## 2023-11-14 DIAGNOSIS — I252 Old myocardial infarction: Secondary | ICD-10-CM | POA: Diagnosis not present

## 2023-11-14 DIAGNOSIS — E785 Hyperlipidemia, unspecified: Secondary | ICD-10-CM

## 2023-11-14 DIAGNOSIS — I110 Hypertensive heart disease with heart failure: Secondary | ICD-10-CM | POA: Diagnosis not present

## 2023-11-14 DIAGNOSIS — Z7902 Long term (current) use of antithrombotics/antiplatelets: Secondary | ICD-10-CM | POA: Insufficient documentation

## 2023-11-14 LAB — COMPREHENSIVE METABOLIC PANEL WITH GFR
ALT: 24 U/L (ref 0–44)
AST: 24 U/L (ref 15–41)
Albumin: 4 g/dL (ref 3.5–5.0)
Alkaline Phosphatase: 60 U/L (ref 38–126)
Anion gap: 10 (ref 5–15)
BUN: 18 mg/dL (ref 8–23)
CO2: 24 mmol/L (ref 22–32)
Calcium: 9 mg/dL (ref 8.9–10.3)
Chloride: 104 mmol/L (ref 98–111)
Creatinine, Ser: 0.99 mg/dL (ref 0.61–1.24)
GFR, Estimated: >60 mL/min (ref >60)
Glucose, Bld: 157 mg/dL — ABNORMAL HIGH (ref 70–99)
Potassium: 3.8 mmol/L (ref 3.5–5.1)
Sodium: 138 mmol/L (ref 135–145)
Total Bilirubin: 1.2 mg/dL (ref 0.0–1.2)
Total Protein: 6.8 g/dL (ref 6.5–8.1)

## 2023-11-14 LAB — LIPID PANEL
Cholesterol: 174 mg/dL (ref 0–200)
HDL: 34 mg/dL — ABNORMAL LOW (ref >40)
LDL Cholesterol: 113 mg/dL — ABNORMAL HIGH (ref 0–99)
Total CHOL/HDL Ratio: 5.1 ratio
Triglycerides: 136 mg/dL (ref <150)
VLDL: 27 mg/dL (ref 0–40)

## 2023-11-14 MED ORDER — AMLODIPINE BESYLATE 5 MG PO TABS: 2.5000 mg | ORAL_TABLET | Freq: Every day | ORAL | Status: AC

## 2023-11-14 NOTE — Telephone Encounter (Signed)
 Spoke with patient regarding the following results. Patient made aware and patient verbalized understanding. Patient has not been taking Zetia- he will re start this and follow up in 6 weeks. Repeat labs ordered and scheduled patient aware of appointment time and date. Will make provider aware. Advised patient to call back to office with any issues, questions, or concerns. Patient verbalized understanding.

## 2023-11-14 NOTE — Progress Notes (Addendum)
 [facility] Progress Note    ID:  DUEY LILLER, DOB 1945-09-27, MRN [id number]   Provider location: [facility] Type of Visit: Established patient   PCP:  Kaleen Mask, MD  Cardiologist:  Dr. Shirlee Latch  Reason for Visit/CC: F/u for chronic systolic heart failure, HFmrEF and CAD    History of Present Illness: [name] is a 78 y.o. male who has a history of CAD s/p anterolateral STEMI in 1/08 and ischemic cardiomyopathy. He had a cardiac cath with patent LAD stent in 3/11.  Echo in 12/14 showed EF 50-55% with apical septal and apical hypokinesis.    In 5/17, patient was seen by [name] for chest pain evaluation.  He was noting chest tightness when carrying a heavy load or exerting himself moderately to heavily.  In fact, he ended up retiring due to the exertional chest pain.  He had generalized fatigue that was new.    Echo was done in 5/17,  showing EF down to 35-40%. Cardiolite was done, showing EF 36% with large apical infarction.  LHC was done, showing patent LAD stent and 70% stenosis in a small branch off OM1, medical management.   Echo in 7/20 showed EF 40-45% with mild LVH and mild LV dilation, normal RV systolic function.  Echo in 4/22 showed EF 45-50% with apical akinesis, no LV thrombus, normal RV.  Echo in 7/23 showed EF 40-45%, septal/apical akinesis, RV normal.   Echo 12/24 EF 45-50%, RV normal   Last OV, he was doing well w/o CP and stable NYHA Class I-II symptoms. His BP was elevated and amlodipine was added to regimen. Zetia was also stopped and switched to Crestor as LDL was not at goal.   He returns today for 6 month f/u. Doing well. Denies CP. No dyspnea. Active. Goes to park to walk for exercise. Can walk several miles w/o limitations. Also occasionally jogs w/o limitation. No LEE. Stopped taking eplerenone because it made him feel tired. Compliant w/ all other meds. Tolerating ok w/o side effects. BP today  130/76.     Labs (11/15): LDL 87, HDL 36, HCT 45.5, K 3.4, creatinine 1.0, LFTs normal Labs (5/17): LDL 73, HDL 28, TSH normal, K 3.4, creatinine 1.06, HCT 44.6 Labs (6/17): BNP 81, K 3.2, creatinine 1.18 Labs (7/17): K 4, creatinine 0.95 Labs (2/20): K 4, creatinine 1.07, LDL 62, HDL 26 Labs (7/20): K 4.1, creatinine 0.99 Labs (12/20): K 3.8, creatinine 1.05, LDL 59 Labs (4/21): LDL 73, HDL 30, TGs 120, hgb 15.9, K 4, creatinine 1.25, AST/ALT normal Labs (12/21): creatinine 1.05 Labs (6/22): LDL 60, TGs 299 Labs (7/22): Creatinine 1.13 Labs (7/23): LDL 64, K 3.8, creatinine 7.82 Labs (10/24): LDL 86, K 4.3, creatinine 9.56   ECG (personally reviewed): not performed   PMH: 1. CAD: Anterolateral STEMI in 1/08 with BMS to LAD (had luminals in LCx and RCA). Myoview in 1/10 showed EF 45% with anteroseptal infarction and mild peri-infarction ischemia.  LHC (3/11) with patent LAD stent, EF 40-45%.  - ETT-Cardiolite (6/17) with EF 36%, 6 min exercise, hypertensive BP response, large apical infarct.   - LHC (6/17): patent LAD stent, 70% stenosis small branch off OM1.  2. Ischemic cardiomyopathy: EF 30% at time of STEMI.  Most recent study was LV-gram in 3/11 with EF 40-45%.  Echo (12/14) showed EF 50-55%, apical septal hypokinesis an apical hypokinesis, no LV thrombus.  - Echo (6/17) with EF 35-40%, septal/apical akinesis, moderate  LV dilation, mild MR.  - Echo (7/20): EF 40-45% with mild LVH and mild LV dilation, normal RV systolic function. - Echo (4/22): EF 40-45% with mild LVH and mild LV dilation, normal RV systolic function.  Echo was done today and reviewed, EF 45-50% with apical akinesis, no LV thrombus, normal RV.  - Echo (7/23): EF 40-45%, septal/apical akinesis, RV normal. 3. HTN 4. Hyperlipidemia 5. ABIs in 10/14 were normal. 6. Abdominal US in 10/14 showed no AAA.  7. Aspirin allergy: Questionable.  Had hives once after taking aspirin but has taken it since without problems.    8. 8/20 sleep study showed mild OSA.  9. Depression  SH: Married, has children, lives in [place], retired, nonsmoker.   FH: CAD  ROS: All systems reviewed and negative except as per HPI.   Current Outpatient Medications  Medication Sig Dispense Refill   carvedilol (COREG) 25 MG tablet TAKE 1 TABLET BY MOUTH TWICE DAILY WITH A MEAL 180 tablet 3   clopidogrel (PLAVIX) 75 MG tablet Take 75 mg by mouth daily with breakfast.     empagliflozin (JARDIANCE) 25 MG TABS tablet Take 25 mg by mouth daily.     ezetimibe (ZETIA) 10 MG tablet Take 1 tablet (10 mg total) by mouth daily. 30 tablet 3   rosuvastatin (CRESTOR) 40 MG tablet Take 20 mg by mouth daily.     sacubitril-valsartan (ENTRESTO) 97-103 MG Take 1 tablet by mouth 2 (two) times daily. 60 tablet 3   sertraline (ZOLOFT) 25 MG tablet Take 1 tablet (25 mg total) by mouth daily. 30 tablet 0   amLODipine (NORVASC) 5 MG tablet Take 0.5 tablets (2.5 mg total) by mouth daily.     No current facility-administered medications for this encounter.   Exam:   BP 130/76   Pulse 67   Wt 93.5 kg (206 lb 3.2 oz)   SpO2 97%   BMI 27.20 kg/m  General:  Well appearing. No respiratory difficulty HEENT: normal Neck: supple. no JVD. Carotids 2+ bilat; no bruits. No lymphadenopathy or thyromegaly appreciated. Cor: PMI nondisplaced. Regular rate & rhythm. No rubs, gallops or murmurs. Lungs: clear Abdomen: soft, nontender, nondistended. No hepatosplenomegaly. No bruits or masses. Good bowel sounds. Extremities: no cyanosis, clubbing, rash, edema Neuro: alert & oriented x 3, cranial nerves grossly intact. moves all 4 extremities w/o difficulty. Affect pleasant.   Assessment/Plan:  1. Chronic systolic CHF: Ischemic cardiomyopathy.  EF 35-40% on 6/17 echo.  Echo in 7/20 showed EF 40-45%.  Echo in 4/22 showed EF up to 45-50%, apical akinesis. Echo in 7/23 with EF 40-45%. Echo 12/24 EF 45-50%, RV normal.  Stable NYHA Class I-II symptoms. Euvolemic  on exam  - Continue Jardiance 10 mg daily  - Continue Entresto 97-103 mg bid - Continue Coreg 25 mg bid - Off Eplerenone due to side effects (fatigue/ felt "off")   - Check CMP   2. CAD: H/o remote anterior MI treated w/ LAD stent. LAD stent was patent on cath in 3/11.  Last cath in 6/17 showed patent LAD stent and 70% stenosis in branch off OM1 which was managed medically.  - he is stable w/o chest pain. No exertional dyspnea w/ physical activity  - continue medical therapy  - no ASA due to allergy (hives). Continue Plavix.  - Continue statin and ? blocker  3. Hyperlipidemia, LDL Goal < 55: Last LP 10/24 showed elevated LDL at 86 mg/dl. Simvastatin switched to Crestor 20 mg. Also on Zetia 10 mg daily.  Tolerating Crestor ok w/o myalgias/arthralgias   - will check LP and HFTs today    4. HTN:  - mildly elevated, typically in the 130s systolic at home. 130/76 today - increase amlodipine to 5 mg daily since he is no longer on MRA  - CMP today    F/u w/ Dr. Mitzie Anda in 6 months   Ruddy Corral, PA-C  11/14/2023  [facility] [address] [facility] [place] [address] [phone] (office) [phone] (fax)

## 2023-11-14 NOTE — Patient Instructions (Addendum)
 Good to see you today!  INCREASE Amlodipine to 5 mg daily  Labs done today, your results will be available in MyChart, we will contact you for abnormal readings.  Your physician recommends that you schedule a follow-up appointment: 6 months with Dr. Lamarr Pilling) Call office in August to schedule an appointment  If you have any questions or concerns before your next appointment please send us  a message through [facility] or call our office at [phone].    TO LEAVE A MESSAGE FOR THE NURSE SELECT OPTION 2, PLEASE LEAVE A MESSAGE INCLUDING: YOUR NAME DATE OF BIRTH CALL BACK NUMBER REASON FOR CALL**this is important as we prioritize the call backs  YOU WILL RECEIVE A CALL BACK THE SAME DAY AS LONG AS YOU CALL BEFORE 4:00 PM.hf At the [facility], you and your health needs are our priority. As part of our continuing mission to provide you with exceptional heart care, we have created designated Provider Care Teams. These Care Teams include your primary Cardiologist (physician) and Advanced Practice Providers (APPs- Physician Assistants and Nurse Practitioners) who all work together to provide you with the care you need, when you need it.   You may see any of the following providers on your designated Care Team at your next follow up: Dr Jules Oar Dr Peder Bourdon Dr. Alwin Baars Dr. Arta Lark Amy Marijane Shoulders, NP Ruddy Corral, [place] [facility] [place], [place] Dennise Fitz, NP [place] Lee, NP Shawnee Dellen, NP Luster Salters, PharmD Bevely Brush, PharmD   Please be sure to bring in all your medications bottles to every appointment.    Thank you for choosing [facility]

## 2023-11-14 NOTE — Telephone Encounter (Signed)
-----   Message from [place] sent at 11/14/2023  1:04 PM EDT ----- LDL is not at goal. Elevated at 113. Needs to be < 55. Check compliance w/ Crestor and Zeita. If he has been compliant w/ Crestor, then will need to increase dose to 40 mg daily and repeat Lipid panel and HFTs in 6 wks.   All other labs ok.

## 2023-12-25 ENCOUNTER — Other Ambulatory Visit (HOSPITAL_COMMUNITY)

## 2024-02-12 ENCOUNTER — Ambulatory Visit (INDEPENDENT_AMBULATORY_CARE_PROVIDER_SITE_OTHER)

## 2024-02-12 ENCOUNTER — Ambulatory Visit
Admission: EM | Admit: 2024-02-12 | Discharge: 2024-02-12 | Disposition: A | Discharge status: Home / Self Care | Attending: Family Medicine | Admitting: Family Medicine

## 2024-02-12 DIAGNOSIS — J019 Acute sinusitis, unspecified: Secondary | ICD-10-CM

## 2024-02-12 DIAGNOSIS — R051 Acute cough: Secondary | ICD-10-CM

## 2024-02-12 DIAGNOSIS — I2510 Atherosclerotic heart disease of native coronary artery without angina pectoris: Secondary | ICD-10-CM | POA: Insufficient documentation

## 2024-02-12 DIAGNOSIS — I252 Old myocardial infarction: Secondary | ICD-10-CM | POA: Insufficient documentation

## 2024-02-12 DIAGNOSIS — Z4889 Encounter for other specified surgical aftercare: Secondary | ICD-10-CM | POA: Insufficient documentation

## 2024-02-12 MED ORDER — DOXYCYCLINE HYCLATE 100 MG PO CAPS: 100.0000 mg | ORAL_CAPSULE | Freq: Two times a day (BID) | ORAL | 0 refills | Status: AC

## 2024-02-12 MED ORDER — BENZONATATE 100 MG PO CAPS: 100.0000 mg | ORAL_CAPSULE | Freq: Three times a day (TID) | ORAL | 0 refills | Status: AC | PRN

## 2024-02-12 NOTE — ED Triage Notes (Signed)
 I have a cold/cough/congestion that started a week ago today, sore throat/fever in the beginning but some better but I still have a croupy cough. I want to make sure it is no Pna.

## 2024-02-12 NOTE — Discharge Instructions (Signed)
 By my review, there is no pneumonia or fluid on your chest x-ray. The radiologist will also read your x-ray, and if their interpretation differs significantly from mine, and the management of your condition would change, we will call you.  Take doxycycline  100 mg --1 capsule 2 times daily for 7 days  Take benzonatate  100 mg, 1 tab every 8 hours as needed for cough.

## 2024-02-12 NOTE — ED Provider Notes (Signed)
 [facility]    CSN: [id number] Arrival date & time: 02/12/24  1109      History   Chief Complaint Chief Complaint  Patient presents with   Nasal Congestion    HPI [name] is a 78 y.o. male.   HPI Here for coughing and congestion.  About 7 or 8 days ago he began having some sneezing and coughing and nasal drainage.  He had fever the first day or 2 but that has subsided.  No nausea vomiting or diarrhea.  He has maybe felt a little short of breath sometimes during this week, and maybe a little weak before this started.  The cough has been pretty persistent.  He is allergic to valproic acid and spironolactone  and possibly aspirin.   Past Medical History:  Diagnosis Date   CAD (coronary artery disease)    CHF (congestive heart failure) (HCC)    COVID-19 virus infection 04/2020   History of ST elevation myocardial infarction (STEMI)    ACUTE ANTERIOR LATERAL WITH DEVELOPING Q WAVES   HLD (hyperlipidemia)    HTN (hypertension)    Ischemic cardiomyopathy     Patient Active Problem List   Diagnosis Date Noted   History of myocardial infarction 02/12/2024   Encounter for other specified surgical aftercare 02/12/2024   Arteriosclerosis of coronary artery 02/12/2024   CAD (coronary artery disease) 02/18/2023   Anxiety 02/18/2023   Bipolar I disorder (HCC) 02/18/2023   Type 2 diabetes mellitus (HCC) 02/18/2023   Exposure to potentially hazardous substance 02/18/2023   Gastroesophageal reflux disease 02/18/2023   Myocardial infarct (HCC) 02/18/2023   Hypertension 02/18/2023   Insomnia 02/18/2023   Personal history presenting hazards to health 02/18/2023   Posttraumatic stress disorder 02/18/2023   Psoriasis 02/18/2023   Congestive heart failure (HCC) 02/18/2023   Hypokalemia 02/18/2023   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   Other specified counseling 02/18/2023   Counseling, unspecified 02/18/2023   Pain in joint involving ankle and  foot 02/18/2023   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 Authorizing Provider  amLODipine  (NORVASC ) 5 MG tablet Take 0.5 tablets (2.5 mg total) by mouth daily. 11/14/23  Yes Marcine Catalan M, PA-C  benzonatate  (TESSALON ) 100 MG capsule Take 1 capsule (100 mg total) by mouth 3 (three) times daily as needed for cough. 02/12/24  Yes Vonna Sharlet POUR, MD  carvedilol  (COREG ) 25 MG tablet TAKE 1 TABLET BY MOUTH TWICE DAILY WITH A MEAL 06/11/23  Yes Rolan Ezra RAMAN, MD  clopidogrel  (PLAVIX ) 75 MG tablet Take 75 mg by mouth daily with breakfast.   Yes [provider]  doxycycline  (VIBRAMYCIN ) 100 MG capsule Take 1 capsule (100 mg total) by mouth 2 (two) times daily for 7 days. 02/12/24 02/19/24 Yes Isabeau Mccalla K, MD  empagliflozin (JARDIANCE) 25 MG TABS tablet Take 25 mg by mouth daily.   Yes [provider]  empagliflozin (JARDIANCE) 25 MG TABS tablet Take 12.5 mg by mouth every morning. 05/27/23  Yes [provider]  ezetimibe  (ZETIA ) 10 MG tablet Take 1 tablet (10 mg total) by mouth daily. 11/17/20 05/15/24 Yes Rolan Ezra RAMAN, MD  rosuvastatin  (CRESTOR ) 40 MG tablet Take 20 mg by mouth daily.   Yes [provider]  zolpidem (AMBIEN) 5 MG tablet Take 2.5 mg by mouth at bedtime as needed. 11/15/23  Yes [provider]  erythromycin ophthalmic  ointment Apply 1 Application to eye 3 (three) times daily. 03/19/23   [provider]  sacubitril -valsartan  (ENTRESTO ) 97-103 MG Take 1 tablet by mouth 2 (two) times daily. 08/23/17   Lesia Ozell Barter, PA-C  sertraline  (ZOLOFT ) 25 MG tablet Take 1 tablet (25 mg total) by mouth daily. 05/16/23   Rolan Ezra RAMAN, MD  sertraline  (ZOLOFT ) 50 MG tablet Take 50 mg by mouth daily. 05/17/23   [provider]  triamcinolone cream (KENALOG) 0.1 % Apply 1 Application topically 2 (two) times daily. 05/17/23   [provider]    Family History Family History  Problem Relation Age of Onset   CAD Other    Heart attack Brother    Stroke Other    Hypertension Other     Social History Social History   Tobacco Use   Smoking status: Former    Types: Cigarettes   Smokeless tobacco: Never  Vaping Use   Vaping status: Never Used  Substance Use Topics   Alcohol use: No   Drug use: No     Allergies   Valproic acid, Spironolactone , and Aspirin   Review of Systems Review of Systems   Physical Exam Triage Vital Signs ED Triage Vitals  Encounter Vitals Group     BP 02/12/24 1120 136/75     Girls Systolic BP Percentile --      Girls Diastolic BP Percentile --      Boys Systolic BP Percentile --      Boys Diastolic BP Percentile --      Pulse Rate 02/12/24 1120 72     Resp 02/12/24 1120 18     Temp 02/12/24 1120 98.1 F (36.7 C)     Temp Source 02/12/24 1120 Oral     SpO2 02/12/24 1120 95 %     Weight 02/12/24 1117 205 lb (93 kg)     Height 02/12/24 1117 6' 1 (1.854 m)     Head Circumference --      Peak Flow --      Pain Score 02/12/24 1115 0     Pain Loc --      Pain Education --      Exclude from Growth Chart --    No data found.  Updated Vital Signs BP 136/75 (BP Location: Left Arm)   Pulse 72   Temp 98.1 F (36.7 C) (Oral)   Resp 18   Ht 6' 1 (1.854 m)   Wt 93 kg   SpO2 95%   BMI 27.05 kg/m   Visual Acuity Right Eye Distance:    Left Eye Distance:   Bilateral Distance:    Right Eye Near:   Left Eye Near:    Bilateral Near:     Physical Exam Vitals reviewed.  Constitutional:      General: He is not in acute distress.    Appearance: He is not ill-appearing, toxic-appearing or diaphoretic.  HENT:     Right Ear: Tympanic membrane and ear canal normal.  Left Ear: Tympanic membrane and ear canal normal.     Nose: Congestion present.     Mouth/Throat:     Mouth: Mucous membranes are moist.     Pharynx: No oropharyngeal exudate or posterior oropharyngeal erythema.  Eyes:     Extraocular Movements: Extraocular movements intact.     Conjunctiva/sclera: Conjunctivae normal.     Pupils: Pupils are equal, round, and reactive to light.  Cardiovascular:     Rate and Rhythm: Normal rate and regular rhythm.     Heart sounds: No murmur heard. Pulmonary:     Effort: No respiratory distress.     Breath sounds: No stridor. No wheezing, rhonchi or rales.     Comments: Her movement is good.  Expiratory breath sounds on the right are coarse. Musculoskeletal:     Cervical back: Neck supple.  Lymphadenopathy:     Cervical: No cervical adenopathy.  Skin:    Capillary Refill: Capillary refill takes less than 2 seconds.     Coloration: Skin is not jaundiced or pale.  Neurological:     General: No focal deficit present.     Mental Status: He is alert and oriented to person, place, and time.  Psychiatric:        Behavior: Behavior normal.      UC Treatments / Results  Labs (all labs ordered are listed, but only abnormal results are displayed) Labs Reviewed - No data to display  EKG   Radiology No results found.  Procedures Procedures (including critical care time)  Medications Ordered in UC Medications - No data to display  Initial Impression / Assessment and Plan / UC Course  I have reviewed the triage vital signs and the nursing notes.  Pertinent labs & imaging results that were available during my  care of the patient were reviewed by me and considered in my medical decision making (see chart for details).     Chest x-ray by my review does not show any infiltrate.  Doxycycline  is sent in for possible acute sinusitis and Tessalon  pearls were sent in for the cough.  He is advised of radiology overread. Final Clinical Impressions(s) / UC Diagnoses   Final diagnoses:  Acute cough  Acute sinusitis, recurrence not specified, unspecified location     Discharge Instructions      By my review, there is no pneumonia or fluid on your chest x-ray. The radiologist will also read your x-ray, and if their interpretation differs significantly from mine, and the management of your condition would change, we will call you.  Take doxycycline  100 mg --1 capsule 2 times daily for 7 days  Take benzonatate  100 mg, 1 tab every 8 hours as needed for cough.       ED Prescriptions     Medication Sig Dispense Auth. Provider   doxycycline  (VIBRAMYCIN ) 100 MG capsule Take 1 capsule (100 mg total) by mouth 2 (two) times daily for 7 days. 14 capsule Chania Kochanski K, MD   benzonatate  (TESSALON ) 100 MG capsule Take 1 capsule (100 mg total) by mouth 3 (three) times daily as needed for cough. 21 capsule Moses Odoherty K, MD      PDMP not reviewed this encounter.   Vonna Sharlet POUR, MD 02/12/24 1226
